# Patient Record
Sex: Male | Born: 1960 | Race: White | Hispanic: No | Marital: Married | Smoking: Former smoker
Health system: Southern US, Community
[De-identification: ages and names within clinical notes are randomized; demographics above are authoritative.]

## PROBLEM LIST (undated history)

## (undated) DIAGNOSIS — J302 Other seasonal allergic rhinitis: Secondary | ICD-10-CM

## (undated) DIAGNOSIS — E785 Hyperlipidemia, unspecified: Secondary | ICD-10-CM

## (undated) DIAGNOSIS — I10 Essential (primary) hypertension: Secondary | ICD-10-CM

## (undated) DIAGNOSIS — E119 Type 2 diabetes mellitus without complications: Secondary | ICD-10-CM

## (undated) HISTORY — PX: VASECTOMY: SHX75

## (undated) HISTORY — PX: APPENDECTOMY: SHX54

## (undated) HISTORY — DX: Essential (primary) hypertension: I10

## (undated) HISTORY — DX: Other seasonal allergic rhinitis: J30.2

## (undated) HISTORY — DX: Hyperlipidemia, unspecified: E78.5

## (undated) HISTORY — DX: Type 2 diabetes mellitus without complications: E11.9

---

## 2014-02-13 LAB — LIPID PANEL
CHOLESTEROL: 136 mg/dL (ref 0–200)
HDL: 29 mg/dL — AB (ref 35–70)
LDL Cholesterol: 84 mg/dL
Triglycerides: 117 mg/dL (ref 40–160)

## 2014-02-13 LAB — BASIC METABOLIC PANEL
BUN: 12 mg/dL (ref 4–21)
Creatinine: 0.9 mg/dL (ref <=1.3)

## 2014-02-13 LAB — PSA: PSA: 0.3

## 2014-09-21 LAB — HEMOGLOBIN A1C: HEMOGLOBIN A1C: 7.5 % — AB (ref 4.0–6.0)

## 2015-02-15 ENCOUNTER — Other Ambulatory Visit: Payer: Self-pay | Admitting: Internal Medicine

## 2015-02-15 ENCOUNTER — Encounter: Payer: Self-pay | Admitting: Internal Medicine

## 2015-02-15 DIAGNOSIS — E119 Type 2 diabetes mellitus without complications: Secondary | ICD-10-CM | POA: Insufficient documentation

## 2015-02-15 DIAGNOSIS — E785 Hyperlipidemia, unspecified: Secondary | ICD-10-CM | POA: Insufficient documentation

## 2015-02-15 DIAGNOSIS — N529 Male erectile dysfunction, unspecified: Secondary | ICD-10-CM | POA: Insufficient documentation

## 2015-02-15 DIAGNOSIS — Z720 Tobacco use: Secondary | ICD-10-CM | POA: Insufficient documentation

## 2015-02-15 DIAGNOSIS — B353 Tinea pedis: Secondary | ICD-10-CM | POA: Insufficient documentation

## 2015-02-15 DIAGNOSIS — I10 Essential (primary) hypertension: Secondary | ICD-10-CM | POA: Insufficient documentation

## 2015-02-22 ENCOUNTER — Encounter: Payer: Self-pay | Admitting: Internal Medicine

## 2015-02-22 ENCOUNTER — Other Ambulatory Visit: Payer: Self-pay | Admitting: Internal Medicine

## 2015-04-14 ENCOUNTER — Other Ambulatory Visit: Payer: Self-pay | Admitting: Internal Medicine

## 2015-05-15 ENCOUNTER — Other Ambulatory Visit: Payer: Self-pay | Admitting: Internal Medicine

## 2015-05-28 ENCOUNTER — Ambulatory Visit: Payer: Self-pay | Admitting: Internal Medicine

## 2015-06-21 ENCOUNTER — Ambulatory Visit: Payer: Self-pay | Admitting: Family Medicine

## 2015-06-28 ENCOUNTER — Ambulatory Visit (INDEPENDENT_AMBULATORY_CARE_PROVIDER_SITE_OTHER): Payer: BLUE CROSS/BLUE SHIELD | Admitting: Family Medicine

## 2015-06-28 ENCOUNTER — Encounter: Payer: Self-pay | Admitting: Family Medicine

## 2015-06-28 VITALS — BP 146/76 | HR 99 | Temp 98.2°F | Ht 72.0 in | Wt 217.5 lb

## 2015-06-28 DIAGNOSIS — I10 Essential (primary) hypertension: Secondary | ICD-10-CM | POA: Diagnosis not present

## 2015-06-28 DIAGNOSIS — E119 Type 2 diabetes mellitus without complications: Secondary | ICD-10-CM

## 2015-06-28 DIAGNOSIS — Z130 Encounter for screening for diseases of the blood and blood-forming organs and certain disorders involving the immune mechanism: Secondary | ICD-10-CM | POA: Diagnosis not present

## 2015-06-28 DIAGNOSIS — Z125 Encounter for screening for malignant neoplasm of prostate: Secondary | ICD-10-CM

## 2015-06-28 DIAGNOSIS — Z0000 Encounter for general adult medical examination without abnormal findings: Secondary | ICD-10-CM

## 2015-06-28 DIAGNOSIS — E785 Hyperlipidemia, unspecified: Secondary | ICD-10-CM

## 2015-06-28 LAB — CBC
HCT: 48.3 % (ref 39.0–52.0)
HEMOGLOBIN: 16 g/dL (ref 13.0–17.0)
MCHC: 33.2 g/dL (ref 30.0–36.0)
MCV: 89.2 fl (ref 78.0–100.0)
Platelets: 277 10*3/uL (ref 150.0–400.0)
RBC: 5.41 Mil/uL (ref 4.22–5.81)
RDW: 12.8 % (ref 11.5–15.5)
WBC: 10.4 10*3/uL (ref 4.0–10.5)

## 2015-06-28 LAB — COMPREHENSIVE METABOLIC PANEL
ALK PHOS: 100 U/L (ref 39–117)
ALT: 26 U/L (ref 0–53)
AST: 18 U/L (ref 0–37)
Albumin: 4.3 g/dL (ref 3.5–5.2)
BILIRUBIN TOTAL: 0.4 mg/dL (ref 0.2–1.2)
BUN: 17 mg/dL (ref 6–23)
CO2: 27 meq/L (ref 19–32)
CREATININE: 0.85 mg/dL (ref 0.40–1.50)
Calcium: 9.7 mg/dL (ref 8.4–10.5)
Chloride: 101 mEq/L (ref 96–112)
GFR: 100.07 mL/min (ref >60.00)
GLUCOSE: 189 mg/dL — AB (ref 70–99)
Potassium: 4.7 mEq/L (ref 3.5–5.1)
SODIUM: 138 meq/L (ref 135–145)
TOTAL PROTEIN: 6.8 g/dL (ref 6.0–8.3)

## 2015-06-28 LAB — MICROALBUMIN / CREATININE URINE RATIO
Creatinine,U: 110.2 mg/dL
Microalb Creat Ratio: 4.5 mg/g (ref 0.0–30.0)
Microalb, Ur: 5 mg/dL — ABNORMAL HIGH (ref 0.0–1.9)

## 2015-06-28 LAB — LIPID PANEL
Cholesterol: 175 mg/dL (ref 0–200)
HDL: 31.5 mg/dL — AB (ref >39.00)
NONHDL: 143.91
Total CHOL/HDL Ratio: 6
Triglycerides: 210 mg/dL — ABNORMAL HIGH (ref 0.0–149.0)
VLDL: 42 mg/dL — ABNORMAL HIGH (ref 0.0–40.0)

## 2015-06-28 LAB — PSA: PSA: 0.26 ng/mL (ref 0.10–4.00)

## 2015-06-28 LAB — HEMOGLOBIN A1C: HEMOGLOBIN A1C: 7.5 % — AB (ref 4.6–6.5)

## 2015-06-28 LAB — LDL CHOLESTEROL, DIRECT: Direct LDL: 117 mg/dL

## 2015-06-28 MED ORDER — AMLODIPINE BESY-BENAZEPRIL HCL 5-10 MG PO CAPS: 2.0000 | ORAL_CAPSULE | Freq: Every day | ORAL | Status: DC

## 2015-06-28 NOTE — Assessment & Plan Note (Signed)
Uncontrolled. Increased amlodipine/benazepril today.

## 2015-06-28 NOTE — Progress Notes (Signed)
Pre visit review using our clinic review tool, if applicable. No additional management support is needed unless otherwise documented below in the visit note. 

## 2015-06-28 NOTE — Assessment & Plan Note (Signed)
Lipid panel today Continue lipitor 

## 2015-06-28 NOTE — Addendum Note (Signed)
Addended by: Tommie Sams on: 06/28/2015 10:16 PM   Modules accepted: Orders

## 2015-06-28 NOTE — Progress Notes (Signed)
Subjective:  Patient ID: Chad Frye, male    DOB: [birth year]  Age: 55 y.o. MRN: [id number]  CC: Establish care, Chronic issues  HPI Chad Frye is a 55 y.o. male presents to the clinic today to establish care. Additional issues are below.  Preventative Healthcare  Colonoscopy: Has never had screening. Declines colonoscopy. Interested in [employer].   Immunizations  Tetanus - declines.  Pneumococcal - declines.  Flu - declines.   Prostate cancer screening: Will discuss today.  Hepatitis C screening - Declines.   Labs: Screening labs today.   Exercise: Does exercise.   Alcohol use: See below.   Smoking/tobacco use: Current smoker.   Regular dental exams: Dentures.   Wears seat belt: Yes.   DM-2  Last documented A1c was 7.5 (May 2016). Uncontrolled.   Currently on metformin and Onglyza.  Does not check sugars.  Tolerating without difficulty.  HTN  Not at goal.  Current on Amlodipine/Benzepril 5/10.  HLD  Last LDL was 84 (2015).  Unsure of control at this time.  Tolerating Lipitor without difficulty.   PMH, Surgical Hx, Family Hx, Social History reviewed and updated as below.  Past Medical History  Diagnosis Date  . Essential hypertension   . DM type 2 (diabetes mellitus, type 2) ([place])   . Hyperlipidemia   . Seasonal allergies      Past Surgical History  Procedure Laterality Date  . Appendectomy    . Vasectomy     Family History  Problem Relation Age of Onset  . Diabetes Mother   . Heart disease Mother   . Stroke Father   . Prostate cancer Father   . Hypertension Father   . COPD Sister   . Heart disease Brother     single heart bypass  . Hypertension Maternal Grandmother    Social History  Substance Use Topics  . Smoking status: Current Every Day Smoker  . Smokeless tobacco: Never Used  . Alcohol Use: 2.4 oz/week    4 Standard drinks or equivalent per week   Review of Systems Complete ROS was negative. See scanned  document. Objective:   Today's Vitals: BP 146/76 mmHg  Pulse 99  Temp(Src) 98.2 F (36.8 C) (Oral)  Ht 6' (1.829 m)  Wt 217 lb 8 oz (98.657 kg)  BMI 29.49 kg/m2  SpO2 97%  Physical Exam  Constitutional: He is oriented to person, place, and time. He appears well-developed. No distress.  HENT:  Head: Normocephalic and atraumatic.  Nose: Nose normal.  Mouth/Throat: Oropharynx is clear and moist. No oropharyngeal exudate.  Normal TM's bilaterally.  Dentures in place (upper and lower).  Eyes: Conjunctivae are normal. No scleral icterus.  Neck: Neck supple.  Cardiovascular: Normal rate and regular rhythm.   No murmur heard. Pulmonary/Chest: Effort normal and breath sounds normal. He has no wheezes. He has no rales.  Abdominal: Soft. He exhibits no distension. There is no tenderness. There is no rebound and no guarding.  Musculoskeletal: Normal range of motion. He exhibits no edema.  Lymphadenopathy:    He has no cervical adenopathy.  Neurological: He is alert and oriented to person, place, and time.  Skin: Skin is warm and dry. No rash noted.  Psychiatric: He has a normal mood and affect.  Vitals reviewed.  Assessment & Plan:   Problem List Items Addressed This Visit    Preventative health care - Primary    Declined flu, Tdap, Pneumovax. After discussion, patient elected to proceed with PSA screening. He declined  rectal exam. Patient does not want a colonoscopy but will proceed with cologuard. Will place order.  Discussed smoking cessation but patient not interested at this time.       Hyperlipidemia    Lipid panel today.  Continue lipitor.      Relevant Medications   amLODipine-benazepril (LOTREL) 5-10 MG capsule   Other Relevant Orders   Lipid Profile   Essential hypertension    Uncontrolled. Increased amlodipine/benazepril today.      Relevant Medications   amLODipine-benazepril (LOTREL) 5-10 MG capsule   Other Relevant Orders   Comp Met (CMET)   Diabetes  mellitus type 2, uncomplicated (HCC)    Uncontrolled. A1C today.  Continuing Onglyza and metformin. Will await A1C prior to altering therapy.       Relevant Medications   amLODipine-benazepril (LOTREL) 5-10 MG capsule   Other Relevant Orders   HgB A1c    Other Visit Diagnoses    Screening for deficiency anemia        Relevant Orders    CBC    Urine Microalbumin w/creat. ratio    Prostate cancer screening        Relevant Orders    PSA       Outpatient Encounter Prescriptions as of 06/28/2015  Medication Sig  . amLODipine-benazepril (LOTREL) 5-10 MG capsule Take 2 capsules by mouth daily.  Marland Kitchen aspirin 81 MG tablet Take 1 tablet by mouth daily.  Marland Kitchen atorvastatin (LIPITOR) 40 MG tablet Take 1.5 tablets by mouth at bedtime.  Marland Kitchen b complex vitamins tablet Take 1 tablet by mouth daily.  . Cinnamon 500 MG TABS Take 8 tablets by mouth daily.  . metFORMIN (GLUCOPHAGE) 1000 MG tablet TAKE 1 TABLET BY MOUTH EVERY MORNING AND 1 AND 1/2 TABLET EVERY EVENING  . MULTIPLE VITAMIN PO Take 1 tablet by mouth daily.  . ONGLYZA 5 MG TABS tablet take 1 tablet by mouth once daily  . vitamin B-12 (CYANOCOBALAMIN) 1000 MCG tablet Take 1,000 mcg by mouth daily.  . [DISCONTINUED] amLODipine-benazepril (LOTREL) 5-10 MG capsule take 1 capsule by mouth once daily  . [DISCONTINUED] sildenafil (VIAGRA) 100 MG tablet Take 1 tablet by mouth daily as needed.   No facility-administered encounter medications on file as of 06/28/2015.    Follow-up: Return in about 6 months (around 12/26/2015).  Otho

## 2015-06-28 NOTE — Patient Instructions (Addendum)
It was nice to see you today.  I have increased your blood pressure medication. Take 2 capsules daily.  We will call with lab results.   Follow up:  Return in about 6 months (around 12/26/2015). You will need repeat metabolic panel in 7-10 days given the increase in your blood pressure medication.  Take care  Dr. Adriana Simas  Health Maintenance, Male A healthy lifestyle and preventative care can promote health and wellness.  Maintain regular health, dental, and eye exams.  Eat a healthy diet. Foods like vegetables, fruits, whole grains, low-fat dairy products, and lean protein foods contain the nutrients you need and are low in calories. Decrease your intake of foods high in solid fats, added sugars, and salt. Get information about a proper diet from your health care provider, if necessary.  Regular physical exercise is one of the most important things you can do for your health. Most adults should get at least 150 minutes of moderate-intensity exercise (any activity that increases your heart rate and causes you to sweat) each week. In addition, most adults need muscle-strengthening exercises on 2 or more days a week.   Maintain a healthy weight. The body mass index (BMI) is a screening tool to identify possible weight problems. It provides an estimate of body fat based on height and weight. Your health care provider can find your BMI and can help you achieve or maintain a healthy weight. For males 20 years and older:  A BMI below 18.5 is considered underweight.  A BMI of 18.5 to 24.9 is normal.  A BMI of 25 to 29.9 is considered overweight.  A BMI of 30 and above is considered obese.  Maintain normal blood lipids and cholesterol by exercising and minimizing your intake of saturated fat. Eat a balanced diet with plenty of fruits and vegetables. Blood tests for lipids and cholesterol should begin at age 44 and be repeated every 5 years. If your lipid or cholesterol levels are high, you are over  age 69, or you are at high risk for heart disease, you may need your cholesterol levels checked more frequently.Ongoing high lipid and cholesterol levels should be treated with medicines if diet and exercise are not working.  If you smoke, find out from your health care provider how to quit. If you do not use tobacco, do not start.  Lung cancer screening is recommended for adults aged 55-80 years who are at high risk for developing lung cancer because of a history of smoking. A yearly low-dose CT scan of the lungs is recommended for people who have at least a 30-pack-year history of smoking and are current smokers or have quit within the past 15 years. A pack year of smoking is smoking an average of 1 pack of cigarettes a day for 1 year (for example, a 30-pack-year history of smoking could mean smoking 1 pack a day for 30 years or 2 packs a day for 15 years). Yearly screening should continue until the smoker has stopped smoking for at least 15 years. Yearly screening should be stopped for people who develop a health problem that would prevent them from having lung cancer treatment.  If you choose to drink alcohol, do not have more than 2 drinks per day. One drink is considered to be 12 oz (360 mL) of beer, 5 oz (150 mL) of wine, or 1.5 oz (45 mL) of liquor.  Avoid the use of street drugs. Do not share needles with anyone. Ask for help if you  need support or instructions about stopping the use of drugs.  High blood pressure causes heart disease and increases the risk of stroke. High blood pressure is more likely to develop in:  People who have blood pressure in the end of the normal range (100-139/85-89 mm Hg).  People who are overweight or obese.  People who are African American.  If you are 54-57 years of age, have your blood pressure checked every 3-5 years. If you are 22 years of age or older, have your blood pressure checked every year. You should have your blood pressure measured twice--once  when you are at a hospital or clinic, and once when you are not at a hospital or clinic. Record the average of the two measurements. To check your blood pressure when you are not at a hospital or clinic, you can use:  An automated blood pressure machine at a pharmacy.  A home blood pressure monitor.  If you are 59-7 years old, ask your health care provider if you should take aspirin to prevent heart disease.  Diabetes screening involves taking a blood sample to check your fasting blood sugar level. This should be done once every 3 years after age 22 if you are at a normal weight and without risk factors for diabetes. Testing should be considered at a younger age or be carried out more frequently if you are overweight and have at least 1 risk factor for diabetes.  Colorectal cancer can be detected and often prevented. Most routine colorectal cancer screening begins at the age of 29 and continues through age 60. However, your health care provider may recommend screening at an earlier age if you have risk factors for colon cancer. On a yearly basis, your health care provider may provide home test kits to check for hidden blood in the stool. A small camera at the end of a tube may be used to directly examine the colon (sigmoidoscopy or colonoscopy) to detect the earliest forms of colorectal cancer. Talk to your health care provider about this at age 43 when routine screening begins. A direct exam of the colon should be repeated every 5-10 years through age 34, unless early forms of precancerous polyps or small growths are found.  People who are at an increased risk for hepatitis B should be screened for this virus. You are considered at high risk for hepatitis B if:  You were born in a country where hepatitis B occurs often. Talk with your health care provider about which countries are considered high risk.  Your parents were born in a high-risk country and you have not received a shot to protect  against hepatitis B (hepatitis B vaccine).  You have HIV or AIDS.  You use needles to inject street drugs.  You live with, or have sex with, someone who has hepatitis B.  You are a man who has sex with other men (MSM).  You get hemodialysis treatment.  You take certain medicines for conditions like cancer, organ transplantation, and autoimmune conditions.  Hepatitis C blood testing is recommended for all people born from [birth year] through 1964 and any individual with known risk factors for hepatitis C.  Healthy men should no longer receive prostate-specific antigen (PSA) blood tests as part of routine cancer screening. Talk to your health care provider about prostate cancer screening.  Testicular cancer screening is not recommended for adolescents or adult males who have no symptoms. Screening includes self-exam, a health care provider exam, and other screening tests. Consult with  your health care provider about any symptoms you have or any concerns you have about testicular cancer.  Practice safe sex. Use condoms and avoid high-risk sexual practices to reduce the spread of sexually transmitted infections (STIs).  You should be screened for STIs, including gonorrhea and chlamydia if:  You are sexually active and are younger than 24 years.  You are older than 24 years, and your health care provider tells you that you are at risk for this type of infection.  Your sexual activity has changed since you were last screened, and you are at an increased risk for chlamydia or gonorrhea. Ask your health care provider if you are at risk.  If you are at risk of being infected with HIV, it is recommended that you take a prescription medicine daily to prevent HIV infection. This is called pre-exposure prophylaxis (PrEP). You are considered at risk if:  You are a man who has sex with other men (MSM).  You are a heterosexual man who is sexually active with multiple partners.  You take drugs by  injection.  You are sexually active with a partner who has HIV.  Talk with your health care provider about whether you are at high risk of being infected with HIV. If you choose to begin PrEP, you should first be tested for HIV. You should then be tested every 3 months for as long as you are taking PrEP.  Use sunscreen. Apply sunscreen liberally and repeatedly throughout the day. You should seek shade when your shadow is shorter than you. Protect yourself by wearing long sleeves, pants, a wide-brimmed hat, and sunglasses year round whenever you are outdoors.  Tell your health care provider of new moles or changes in moles, especially if there is a change in shape or color. Also, tell your health care provider if a mole is larger than the size of a pencil eraser.  A one-time screening for abdominal aortic aneurysm (AAA) and surgical repair of large AAAs by ultrasound is recommended for men aged 71-75 years who are current or former smokers.  Stay current with your vaccines (immunizations).   This information is not intended to replace advice given to you by your health care provider. Make sure you discuss any questions you have with your health care provider.   Document Released: 11/04/2007 Document Revised: 05/29/2014 Document Reviewed: 10/03/2010 Elsevier Interactive Patient Education [employer].

## 2015-06-28 NOTE — Assessment & Plan Note (Signed)
Uncontrolled. A1C today.  Continuing Onglyza and metformin. Will await A1C prior to altering therapy.

## 2015-06-28 NOTE — Assessment & Plan Note (Signed)
Declined flu, Tdap, Pneumovax. After discussion, patient elected to proceed with PSA screening. He declined rectal exam. Patient does not want a colonoscopy but will proceed with cologuard. Will place order.  Discussed smoking cessation but patient not interested at this time.

## 2015-06-29 ENCOUNTER — Other Ambulatory Visit: Payer: Self-pay | Admitting: Family Medicine

## 2015-06-29 ENCOUNTER — Other Ambulatory Visit: Payer: Self-pay

## 2015-06-29 ENCOUNTER — Encounter: Payer: Self-pay | Admitting: Family Medicine

## 2015-06-29 MED ORDER — ATORVASTATIN CALCIUM 80 MG PO TABS: 80.0000 mg | ORAL_TABLET | Freq: Every day | ORAL | Status: DC

## 2015-06-29 MED ORDER — GLIMEPIRIDE 2 MG PO TABS: ORAL_TABLET | ORAL | Status: DC

## 2015-06-29 NOTE — Telephone Encounter (Signed)
Lipitor  changed to Lipitor  per Dr. Adriana Simas

## 2015-07-07 ENCOUNTER — Telehealth: Payer: Self-pay | Admitting: Family Medicine

## 2015-07-07 LAB — HM DIABETES EYE EXAM

## 2015-07-07 NOTE — Telephone Encounter (Signed)
LVTCB

## 2015-07-07 NOTE — Telephone Encounter (Signed)
I don't know what this is for---

## 2015-07-07 NOTE — Telephone Encounter (Signed)
Chad Frye spoke with patient, labs were already drawn.

## 2015-07-07 NOTE — Telephone Encounter (Signed)
Patient is requesting labs, just had labs done 2/6.  Please advise. thanks

## 2015-07-07 NOTE — Telephone Encounter (Signed)
Pt called about needing order for metabolic lab work. Let me know. Call pt @ [phone]. Thank you!

## 2015-07-08 ENCOUNTER — Encounter: Payer: Self-pay | Admitting: Family Medicine

## 2015-07-20 ENCOUNTER — Other Ambulatory Visit: Payer: Self-pay | Admitting: Family Medicine

## 2015-07-20 ENCOUNTER — Other Ambulatory Visit: Payer: Self-pay | Admitting: Internal Medicine

## 2015-07-20 NOTE — Telephone Encounter (Signed)
These medications were not prescribed by Dr.Cook. Please advise?

## 2015-10-26 ENCOUNTER — Other Ambulatory Visit: Payer: Self-pay | Admitting: Internal Medicine

## 2015-11-18 ENCOUNTER — Other Ambulatory Visit: Payer: Self-pay | Admitting: Internal Medicine

## 2015-12-27 ENCOUNTER — Encounter: Payer: Self-pay | Admitting: Family Medicine

## 2015-12-27 ENCOUNTER — Ambulatory Visit (INDEPENDENT_AMBULATORY_CARE_PROVIDER_SITE_OTHER): Payer: BLUE CROSS/BLUE SHIELD | Admitting: Family Medicine

## 2015-12-27 VITALS — BP 146/86 | HR 85 | Temp 97.8°F | Wt 220.4 lb

## 2015-12-27 DIAGNOSIS — Z720 Tobacco use: Secondary | ICD-10-CM | POA: Diagnosis not present

## 2015-12-27 DIAGNOSIS — I10 Essential (primary) hypertension: Secondary | ICD-10-CM | POA: Diagnosis not present

## 2015-12-27 DIAGNOSIS — R7989 Other specified abnormal findings of blood chemistry: Secondary | ICD-10-CM

## 2015-12-27 DIAGNOSIS — E785 Hyperlipidemia, unspecified: Secondary | ICD-10-CM

## 2015-12-27 DIAGNOSIS — E119 Type 2 diabetes mellitus without complications: Secondary | ICD-10-CM

## 2015-12-27 LAB — LIPID PANEL
CHOL/HDL RATIO: 5
Cholesterol: 134 mg/dL (ref 0–200)
HDL: 29.7 mg/dL — ABNORMAL LOW (ref >39.00)
NONHDL: 104.09
TRIGLYCERIDES: 214 mg/dL — AB (ref 0.0–149.0)
VLDL: 42.8 mg/dL — ABNORMAL HIGH (ref 0.0–40.0)

## 2015-12-27 LAB — HEMOGLOBIN A1C: HEMOGLOBIN A1C: 7.9 % — AB (ref 4.6–6.5)

## 2015-12-27 LAB — LDL CHOLESTEROL, DIRECT: LDL DIRECT: 85 mg/dL

## 2015-12-27 MED ORDER — BLOOD GLUCOSE MONITOR KIT: PACK | 0 refills | Status: DC

## 2015-12-27 NOTE — Patient Instructions (Signed)
Continue your current medications.  Follow up in 3-6 months.  Take care  Dr. Latrelle Fuston  

## 2015-12-27 NOTE — Assessment & Plan Note (Signed)
Not at goal. Lipid panel today. Continue Lipitor.

## 2015-12-27 NOTE — Assessment & Plan Note (Signed)
Not at goal. Repeating A1c today. Continue metformin, Amaryl, Onglyza.

## 2015-12-27 NOTE — Progress Notes (Signed)
Subjective:  Patient ID: Chad Frye, male    DOB: 07/04/1960  Age: 55 y.o. MRN: [id number]  CC: Followup  HPI:  55 year old male with hypertension, DM 2, hyperlipidemia, and tobacco abuse presents for follow-up.  HTN  Stable but not at goal.  Compliant with Lotrel.  DM2  Blood sugars readings - Not checking. Wants meter.  Hypoglycemia - No.  Medications - Onglyza, Amaryl, Metformin.  Adverse effects - None.  Compliance - Yes. Preventative care - Up to date.  HLD  Not at goal.  Lipitor was increased at last visit.  Needs recheck today.  Tobacco abuse  Quit.  Has not had a cigarette for 1 month.  Social Hx   Social History   Social History  . Marital status: Married    Spouse name: N/A  . Number of children: N/A  . Years of education: N/A   Social History Main Topics  . Smoking status: Current Every Day Smoker  . Smokeless tobacco: Never Used  . Alcohol use 2.4 oz/week    4 Standard drinks or equivalent per week  . Drug use: No  . Sexual activity: Yes   Other Topics Concern  . None   Social History Narrative  . None   Review of Systems  Constitutional: Negative.   Respiratory: Negative.   Cardiovascular: Negative.    Objective:  BP (!) 146/86 (BP Location: Right Arm, Patient Position: Sitting, Cuff Size: Normal)   Pulse 85   Temp 97.8 F (36.6 C) (Oral)   Wt 220 lb 6 oz (100 kg)   SpO2 95%   BMI 29.89 kg/m   BP/Weight 08/24/8183 10/23/1495  Systolic BP 026 378  Diastolic BP 86 76  Wt. (Lbs) 220.38 217.5  BMI 29.89 29.49   Physical Exam  Constitutional: He is oriented to person, place, and time. He appears well-developed. No distress.  Cardiovascular: Normal rate and regular rhythm.   Pulmonary/Chest: Effort normal. He has no wheezes. He has no rales.  Neurological: He is alert and oriented to person, place, and time.  Psychiatric: He has a normal mood and affect.  Vitals reviewed.  Lab Results  Component Value Date   WBC  10.4 06/28/2015   HGB 16.0 06/28/2015   HCT 48.3 06/28/2015   PLT 277.0 06/28/2015   GLUCOSE 189 (H) 06/28/2015   CHOL 175 06/28/2015   TRIG 210.0 (H) 06/28/2015   HDL 31.50 (L) 06/28/2015   LDLDIRECT 117.0 06/28/2015   LDLCALC 84 02/13/2014   ALT 26 06/28/2015   AST 18 06/28/2015   NA 138 06/28/2015   K 4.7 06/28/2015   CL 101 06/28/2015   CREATININE 0.85 06/28/2015   BUN 17 06/28/2015   CO2 27 06/28/2015   PSA 0.26 06/28/2015   HGBA1C 7.5 (H) 06/28/2015   MICROALBUR 5.0 (H) 06/28/2015    Assessment & Plan:   Problem List Items Addressed This Visit    Diabetes mellitus type 2, uncomplicated ([place])    Not at goal. Repeating A1c today. Continue metformin, Amaryl, Onglyza.      Relevant Orders   HgB A1c   Essential hypertension - Primary    BP elevated today and on repeat. He's nearly a goal. Advised close monitoring at home. If remains elevated at home will increase therapy. Advised weight loss.      Hyperlipidemia    Not at goal. Lipid panel today. Continue Lipitor.      Relevant Orders   Lipid Profile   Tobacco abuse  Patient has quit. Congratulated.       Other Visit Diagnoses   None.     Meds ordered this encounter  Medications  . blood glucose meter kit and supplies KIT    Sig: Dispense based on patient and insurance preference. Use up to four times daily as directed. (FOR ICD-9 250.00, 250.01).    Dispense:  1 each    Refill:  0    Order Specific Question:   Number of strips    Answer:   100    Order Specific Question:   Number of lancets    Answer:   100    Follow-up: 3-6 months.  [name]

## 2015-12-27 NOTE — Progress Notes (Signed)
Pre visit review using our clinic review tool, if applicable. No additional management support is needed unless otherwise documented below in the visit note. 

## 2015-12-27 NOTE — Assessment & Plan Note (Signed)
BP elevated today and on repeat. He's nearly a goal. Advised close monitoring at home. If remains elevated at home will increase therapy. Advised weight loss.

## 2015-12-27 NOTE — Assessment & Plan Note (Signed)
Patient has quit. Congratulated.

## 2016-01-11 ENCOUNTER — Other Ambulatory Visit: Payer: Self-pay | Admitting: Family Medicine

## 2016-01-18 ENCOUNTER — Other Ambulatory Visit: Payer: Self-pay | Admitting: Family Medicine

## 2016-01-21 ENCOUNTER — Encounter: Payer: Self-pay | Admitting: Family Medicine

## 2016-01-21 ENCOUNTER — Ambulatory Visit (INDEPENDENT_AMBULATORY_CARE_PROVIDER_SITE_OTHER): Payer: BLUE CROSS/BLUE SHIELD | Admitting: Family Medicine

## 2016-01-21 DIAGNOSIS — E785 Hyperlipidemia, unspecified: Secondary | ICD-10-CM | POA: Diagnosis not present

## 2016-01-21 DIAGNOSIS — E119 Type 2 diabetes mellitus without complications: Secondary | ICD-10-CM | POA: Diagnosis not present

## 2016-01-21 DIAGNOSIS — I10 Essential (primary) hypertension: Secondary | ICD-10-CM | POA: Diagnosis not present

## 2016-01-21 MED ORDER — GLIMEPIRIDE 4 MG PO TABS
4.0000 mg | ORAL_TABLET | Freq: Every day | ORAL | 1 refills | Status: DC
Start: 2016-01-21 — End: 2016-06-26

## 2016-01-21 NOTE — Assessment & Plan Note (Signed)
Not at goal. Increasing amaryl. Follow up in 3 months.

## 2016-01-21 NOTE — Assessment & Plan Note (Signed)
Well controlled.  Continue Lotrel.

## 2016-01-21 NOTE — Assessment & Plan Note (Signed)
Stable. On max dose lipitor. Could advocate for lower goal. Will continue current treatment and monitor.  Recheck in 3 months.

## 2016-01-21 NOTE — Progress Notes (Signed)
Subjective:  Patient ID: Chad Frye, male    DOB: 1960/07/07  Age: 55 y.o. MRN: [id number]  CC: Follow up  HPI:  55 year old male with hypertension, hyperlipidemia, DM 2 presents for follow-up.  HTN  At goal on Lotrel.  HLD  Stable at this time.  Most recent LDL was 85.  On max dose lipitor and tolerating.  DM-2  Not at goal.  Compliant with metformin, Onglyza, Amaryl.  Most recent A1C was 7.5.  Need to discuss treatment options today.   Social Hx   Social History   Social History  . Marital status: Married    Spouse name: N/A  . Number of children: N/A  . Years of education: N/A   Social History Main Topics  . Smoking status: Former Smoker    Quit date: 11/28/2015  . Smokeless tobacco: Never Used  . Alcohol use 2.4 oz/week    4 Standard drinks or equivalent per week  . Drug use: No  . Sexual activity: Yes   Other Topics Concern  . None   Social History Narrative  . None   Review of Systems  Constitutional: Negative.   Respiratory: Negative.   Cardiovascular: Negative.    Objective:  BP 132/84 (BP Location: Right Arm, Patient Position: Sitting, Cuff Size: Large)   Pulse 76   Temp 98 F (36.7 C) (Oral)   Resp 16   Wt 217 lb (98.4 kg)   BMI 29.43 kg/m   BP/Weight 01/21/2016 12/27/2015 06/28/2015  Systolic BP 132 146 146  Diastolic BP 84 86 76  Wt. (Lbs) 217 220.38 217.5  BMI 29.43 29.89 29.49   Physical Exam  Constitutional: He is oriented to person, place, and time. He appears well-developed. No distress.  Cardiovascular: Normal rate and regular rhythm.   Pulmonary/Chest: Effort normal. He has no wheezes. He has no rales.  Neurological: He is alert and oriented to person, place, and time.  Psychiatric: He has a normal mood and affect.  Vitals reviewed.  Lab Results  Component Value Date   WBC 10.4 06/28/2015   HGB 16.0 06/28/2015   HCT 48.3 06/28/2015   PLT 277.0 06/28/2015   GLUCOSE 189 (H) 06/28/2015   CHOL 134 12/27/2015   TRIG 214.0 (H) 12/27/2015   HDL 29.70 (L) 12/27/2015   LDLDIRECT 85.0 12/27/2015   LDLCALC 84 02/13/2014   ALT 26 06/28/2015   AST 18 06/28/2015   NA 138 06/28/2015   K 4.7 06/28/2015   CL 101 06/28/2015   CREATININE 0.85 06/28/2015   BUN 17 06/28/2015   CO2 27 06/28/2015   PSA 0.26 06/28/2015   HGBA1C 7.9 (H) 12/27/2015   MICROALBUR 5.0 (H) 06/28/2015   Assessment & Plan:   Problem List Items Addressed This Visit    Diabetes mellitus type 2, uncomplicated (HCC)    Not at goal. Increasing amaryl. Follow up in 3 months.      Relevant Medications   glimepiride (AMARYL) 4 MG tablet   Essential hypertension    Well controlled. Continue Lotrel.      Hyperlipidemia    Stable. On max dose lipitor. Could advocate for lower goal. Will continue current treatment and monitor.  Recheck in 3 months.       Other Visit Diagnoses   None.    Meds ordered this encounter  Medications  . glimepiride (AMARYL) 4 MG tablet    Sig: Take 1 tablet (4 mg total) by mouth daily. With first meal of the day.  Dispense:  90 tablet    Refill:  1    Follow-up: Return in about 3 months (around 04/21/2016).  Everlene OtherJayce Jahmeir Geisen DO [facility] [place]

## 2016-01-21 NOTE — Patient Instructions (Signed)
I have increased the amaryl to 4 mg daily.  The rest of your meds are the same.  Follow up in 3 months.  Take care  Dr. Adriana Simasook

## 2016-03-13 ENCOUNTER — Other Ambulatory Visit: Payer: Self-pay | Admitting: Family Medicine

## 2016-04-24 ENCOUNTER — Ambulatory Visit: Payer: BLUE CROSS/BLUE SHIELD | Admitting: Family Medicine

## 2016-05-13 ENCOUNTER — Other Ambulatory Visit: Payer: Self-pay | Admitting: Family Medicine

## 2016-06-13 ENCOUNTER — Other Ambulatory Visit: Payer: Self-pay | Admitting: Family Medicine

## 2016-06-26 ENCOUNTER — Ambulatory Visit (INDEPENDENT_AMBULATORY_CARE_PROVIDER_SITE_OTHER): Payer: BLUE CROSS/BLUE SHIELD | Admitting: Family Medicine

## 2016-06-26 ENCOUNTER — Encounter: Payer: Self-pay | Admitting: Family Medicine

## 2016-06-26 VITALS — BP 147/90 | HR 85 | Temp 98.2°F | Wt 224.0 lb

## 2016-06-26 DIAGNOSIS — E119 Type 2 diabetes mellitus without complications: Secondary | ICD-10-CM | POA: Diagnosis not present

## 2016-06-26 DIAGNOSIS — I10 Essential (primary) hypertension: Secondary | ICD-10-CM

## 2016-06-26 DIAGNOSIS — E785 Hyperlipidemia, unspecified: Secondary | ICD-10-CM | POA: Diagnosis not present

## 2016-06-26 LAB — COMPREHENSIVE METABOLIC PANEL
ALT: 29 U/L (ref 0–53)
AST: 23 U/L (ref 0–37)
Albumin: 4.6 g/dL (ref 3.5–5.2)
Alkaline Phosphatase: 105 U/L (ref 39–117)
BUN: 19 mg/dL (ref 6–23)
CO2: 25 meq/L (ref 19–32)
Calcium: 9.6 mg/dL (ref 8.4–10.5)
Chloride: 105 mEq/L (ref 96–112)
Creatinine, Ser: 0.9 mg/dL (ref 0.40–1.50)
GFR: 93.33 mL/min (ref >60.00)
GLUCOSE: 107 mg/dL — AB (ref 70–99)
Potassium: 3.9 mEq/L (ref 3.5–5.1)
SODIUM: 137 meq/L (ref 135–145)
Total Bilirubin: 0.4 mg/dL (ref 0.2–1.2)
Total Protein: 7.4 g/dL (ref 6.0–8.3)

## 2016-06-26 LAB — LIPID PANEL
CHOL/HDL RATIO: 4
Cholesterol: 104 mg/dL (ref 0–200)
HDL: 26.3 mg/dL — ABNORMAL LOW (ref >39.00)
LDL CALC: 57 mg/dL (ref 0–99)
NONHDL: 78.02
TRIGLYCERIDES: 105 mg/dL (ref 0.0–149.0)
VLDL: 21 mg/dL (ref 0.0–40.0)

## 2016-06-26 LAB — HEMOGLOBIN A1C: Hgb A1c MFr Bld: 7.5 % — ABNORMAL HIGH (ref 4.6–6.5)

## 2016-06-26 MED ORDER — GLIMEPIRIDE 4 MG PO TABS: 4.0000 mg | ORAL_TABLET | Freq: Every day | ORAL | 1 refills | Status: DC

## 2016-06-26 MED ORDER — AMLODIPINE BESY-BENAZEPRIL HCL 5-10 MG PO CAPS: 2.0000 | ORAL_CAPSULE | Freq: Every day | ORAL | 3 refills | Status: DC

## 2016-06-26 MED ORDER — SAXAGLIPTIN HCL 5 MG PO TABS: 5.0000 mg | ORAL_TABLET | Freq: Every day | ORAL | 1 refills | Status: DC

## 2016-06-26 MED ORDER — METFORMIN HCL 1000 MG PO TABS: ORAL_TABLET | ORAL | 1 refills | Status: DC

## 2016-06-26 NOTE — Assessment & Plan Note (Signed)
Not at goal. Lipid panel today. We'll change therapy pending lipid panel results. For now continue Lipitor.

## 2016-06-26 NOTE — Assessment & Plan Note (Signed)
BP elevated today. Patient endorses that his blood pressure is well controlled on. Continuing Lotrel.

## 2016-06-26 NOTE — Assessment & Plan Note (Signed)
Last A1c was 7.9. He endorses that his blood sugars have improved. Rechecking A1c today. Continue metformin, Amaryl, Onglyza.

## 2016-06-26 NOTE — Patient Instructions (Signed)
We will call with your results.  Keep an eye on your pressure. Goal 130/80.  Follow up in 3 months.  Take care  Dr. Adriana Simasook

## 2016-06-26 NOTE — Progress Notes (Signed)
Subjective:  Patient ID: Chad Frye, male    DOB: Jan 11, 1961  Age: 56 y.o. MRN: [id number]  CC: Follow up  HPI:  56 year old male with hypertension, DM 2, hyperlipidemia presents for follow-up.  HTN  BP elevated today. Well controlled at home.  Compliant with Lotrel. No side effects.  DM-2  Blood sugars well controlled at home per his report (120's).  He is currently on metformin, Amaryl, and Onglyza.  Endorses compliance.  HLD  Not at goal given DM-2.  On max dose Lipitor.  Social Hx   Social History   Social History  . Marital status: Married    Spouse name: N/A  . Number of children: N/A  . Years of education: N/A   Social History Main Topics  . Smoking status: Former Smoker    Quit date: 11/28/2015  . Smokeless tobacco: Never Used  . Alcohol use 2.4 oz/week    4 Standard drinks or equivalent per week  . Drug use: No  . Sexual activity: Yes   Other Topics Concern  . None   Social History Narrative  . None   Review of Systems  Respiratory: Negative.   Cardiovascular: Negative.    Objective:  BP (!) 147/90   Pulse 85   Temp 98.2 F (36.8 C) (Oral)   Wt 224 lb (101.6 kg)   SpO2 96%   BMI 30.38 kg/m   BP/Weight 06/26/2016 01/21/2016 12/27/2015  Systolic BP 147 132 146  Diastolic BP 90 84 86  Wt. (Lbs) 224 217 220.38  BMI 30.38 29.43 29.89   Physical Exam  Constitutional: He is oriented to person, place, and time. He appears well-developed. No distress.  Cardiovascular: Normal rate and regular rhythm.   Pulmonary/Chest: Effort normal and breath sounds normal.  Neurological: He is alert and oriented to person, place, and time.  Psychiatric: He has a normal mood and affect.  Vitals reviewed.   Lab Results  Component Value Date   WBC 10.4 06/28/2015   HGB 16.0 06/28/2015   HCT 48.3 06/28/2015   PLT 277.0 06/28/2015   GLUCOSE 189 (H) 06/28/2015   CHOL 134 12/27/2015   TRIG 214.0 (H) 12/27/2015   HDL 29.70 (L) 12/27/2015   LDLDIRECT  85.0 12/27/2015   LDLCALC 84 02/13/2014   ALT 26 06/28/2015   AST 18 06/28/2015   NA 138 06/28/2015   K 4.7 06/28/2015   CL 101 06/28/2015   CREATININE 0.85 06/28/2015   BUN 17 06/28/2015   CO2 27 06/28/2015   PSA 0.26 06/28/2015   HGBA1C 7.9 (H) 12/27/2015   MICROALBUR 5.0 (H) 06/28/2015    Assessment & Plan:   Problem List Items Addressed This Visit    Diabetes mellitus type 2, uncomplicated (HCC)    Last A1c was 7.9. He endorses that his blood sugars have improved. Rechecking A1c today. Continue metformin, Amaryl, Onglyza.      Relevant Medications   amLODipine-benazepril (LOTREL) 5-10 MG capsule   glimepiride (AMARYL) 4 MG tablet   metFORMIN (GLUCOPHAGE) 1000 MG tablet   saxagliptin HCl (ONGLYZA) 5 MG TABS tablet   Other Relevant Orders   Hemoglobin A1c   Essential hypertension - Primary    BP elevated today. Patient endorses that his blood pressure is well controlled on. Continuing Lotrel.      Relevant Medications   amLODipine-benazepril (LOTREL) 5-10 MG capsule   Other Relevant Orders   Comprehensive metabolic panel   Hyperlipidemia    Not at goal. Lipid panel today. We'll change  therapy pending lipid panel results. For now continue Lipitor.      Relevant Medications   amLODipine-benazepril (LOTREL) 5-10 MG capsule   Other Relevant Orders   Lipid panel     Meds ordered this encounter  Medications  . amLODipine-benazepril (LOTREL) 5-10 MG capsule    Sig: Take 2 capsules by mouth daily.    Dispense:  180 capsule    Refill:  3  . glimepiride (AMARYL) 4 MG tablet    Sig: Take 1 tablet (4 mg total) by mouth daily. With first meal of the day.    Dispense:  90 tablet    Refill:  1  . metFORMIN (GLUCOPHAGE) 1000 MG tablet    Sig: take 1 tablet by mouth every morning and 1 and 1/2 tablet every evening    Dispense:  75 tablet    Refill:  1  . saxagliptin HCl (ONGLYZA) 5 MG TABS tablet    Sig: Take 1 tablet (5 mg total) by mouth daily.    Dispense:  90  tablet    Refill:  1   Follow-up: 3 months.  Everlene Other DO [facility]

## 2016-06-26 NOTE — Progress Notes (Signed)
Pre visit review using our clinic review tool, if applicable. No additional management support is needed unless otherwise documented below in the visit note. 

## 2016-06-27 ENCOUNTER — Other Ambulatory Visit: Payer: Self-pay | Admitting: Family Medicine

## 2016-06-27 MED ORDER — EMPAGLIFLOZIN 10 MG PO TABS: 10.0000 mg | ORAL_TABLET | Freq: Every day | ORAL | 3 refills | Status: DC

## 2016-09-18 DIAGNOSIS — E119 Type 2 diabetes mellitus without complications: Secondary | ICD-10-CM | POA: Diagnosis not present

## 2016-09-18 LAB — HM DIABETES EYE EXAM

## 2016-09-19 ENCOUNTER — Encounter: Payer: Self-pay | Admitting: Family Medicine

## 2016-09-25 ENCOUNTER — Ambulatory Visit: Payer: BLUE CROSS/BLUE SHIELD | Admitting: Family Medicine

## 2016-10-20 ENCOUNTER — Ambulatory Visit: Payer: BLUE CROSS/BLUE SHIELD | Admitting: Family Medicine

## 2016-10-27 ENCOUNTER — Encounter: Payer: Self-pay | Admitting: Family Medicine

## 2016-10-27 ENCOUNTER — Ambulatory Visit (INDEPENDENT_AMBULATORY_CARE_PROVIDER_SITE_OTHER): Payer: BLUE CROSS/BLUE SHIELD | Admitting: Family Medicine

## 2016-10-27 VITALS — BP 138/96 | HR 101 | Temp 98.5°F | Resp 16 | Ht 73.0 in | Wt 211.5 lb

## 2016-10-27 DIAGNOSIS — I10 Essential (primary) hypertension: Secondary | ICD-10-CM

## 2016-10-27 DIAGNOSIS — E785 Hyperlipidemia, unspecified: Secondary | ICD-10-CM | POA: Diagnosis not present

## 2016-10-27 DIAGNOSIS — E119 Type 2 diabetes mellitus without complications: Secondary | ICD-10-CM

## 2016-10-27 LAB — POCT GLYCOSYLATED HEMOGLOBIN (HGB A1C): HEMOGLOBIN A1C: 6.4

## 2016-10-27 MED ORDER — AMLODIPINE BESY-BENAZEPRIL HCL 10-40 MG PO CAPS: 1.0000 | ORAL_CAPSULE | Freq: Every day | ORAL | 1 refills | Status: DC

## 2016-10-27 NOTE — Assessment & Plan Note (Signed)
At goal on Lipitor. Continue. 

## 2016-10-27 NOTE — Assessment & Plan Note (Signed)
At goal. A1C 6.4 today. Continue current diabetes meds.

## 2016-10-27 NOTE — Assessment & Plan Note (Signed)
Uncontrolled. Increasing Lotrel. BMP in 7-10 days.

## 2016-10-27 NOTE — Progress Notes (Signed)
Subjective:  Patient ID: Chad Frye, male    DOB: [birth year]  Age: 56 y.o. MRN: [id number]  CC: Follow up  HPI:  56 year old male with hypertension, hyperlipidemia, DM 2 presents for follow-up.  DM 2  Blood sugars readings - [place] 89 - 160's.  Hypoglycemia - No.  Medications - Jardiance, Onglyza, Metformin, Amaryl.  Adverse effects - No.  Compliance - Yes.   HLD  At goal on Lipitor. Continue. No side effects.  HTN  BP elevated today and elevated at home. Goal 1:30/90.  Compliant with Lotrel 5/10 BID.   Social Hx   Social History   Social History  . Marital status: Married    Spouse name: N/A  . Number of children: N/A  . Years of education: N/A   Social History Main Topics  . Smoking status: Former Smoker    Quit date: 11/28/2015  . Smokeless tobacco: Never Used  . Alcohol use 2.4 oz/week    4 Standard drinks or equivalent per week  . Drug use: No  . Sexual activity: Yes   Other Topics Concern  . None   Social History Narrative  . None    Review of Systems  Constitutional: Negative.   Respiratory: Negative.   Cardiovascular: Negative.    Objective:  BP (!) 138/96   Pulse (!) 101   Temp 98.5 F (36.9 C) (Oral)   Resp 16   Ht 6\' 1"  (1.854 m)   Wt 211 lb 8 oz (95.9 kg)   SpO2 96%   BMI 27.90 kg/m   BP/Weight 10/27/2016 06/26/2016 01/21/2016  Systolic BP 138 147 132  Diastolic BP 96 90 84  Wt. (Lbs) 211.5 224 217  BMI 27.9 30.38 29.43   Physical Exam  Constitutional: He is oriented to person, place, and time. He appears well-developed. No distress.  Cardiovascular: Normal rate and regular rhythm.   Pulmonary/Chest: Effort normal and breath sounds normal. He has no wheezes. He has no rales.  Neurological: He is alert and oriented to person, place, and time.  Psychiatric: He has a normal mood and affect.  Vitals reviewed.   Lab Results  Component Value Date   WBC 10.4 06/28/2015   HGB 16.0 06/28/2015   HCT 48.3 06/28/2015   PLT  277.0 06/28/2015   GLUCOSE 107 (H) 06/26/2016   CHOL 104 06/26/2016   TRIG 105.0 06/26/2016   HDL 26.30 (L) 06/26/2016   LDLDIRECT 85.0 12/27/2015   LDLCALC 57 06/26/2016   ALT 29 06/26/2016   AST 23 06/26/2016   NA 137 06/26/2016   K 3.9 06/26/2016   CL 105 06/26/2016   CREATININE 0.90 06/26/2016   BUN 19 06/26/2016   CO2 25 06/26/2016   PSA 0.26 06/28/2015   HGBA1C 6.4 10/27/2016   MICROALBUR 5.0 (H) 06/28/2015    Assessment & Plan:   Problem List Items Addressed This Visit      Cardiovascular and Mediastinum   Essential hypertension    Uncontrolled. Increasing Lotrel. BMP in 7-10 days.       Relevant Medications   amLODipine-benazepril (LOTREL) 10-40 MG capsule     Endocrine   Diabetes mellitus type 2, uncomplicated (HCC) - Primary    At goal. A1C 6.4 today. Continue current diabetes meds.      Relevant Medications   amLODipine-benazepril (LOTREL) 10-40 MG capsule   Other Relevant Orders   POCT glycosylated hemoglobin (Hb A1C) (Completed)   Basic Metabolic Panel (BMET)     Other   Hyperlipidemia  At goal on Lipitor. Continue.       Relevant Medications   amLODipine-benazepril (LOTREL) 10-40 MG capsule      Meds ordered this encounter  Medications  . amLODipine-benazepril (LOTREL) 10-40 MG capsule    Sig: Take 1 capsule by mouth daily.    Dispense:  90 capsule    Refill:  1    Follow-up: 3 months  Charlottie Peragine Adriana Simasook DO [facility] [place]

## 2016-10-27 NOTE — Patient Instructions (Signed)
Increased Lotrel.  Other medications are the same.  Follow up in 3 months.  Labs in 7-10 days.  Take care  Dr. Adriana Simasook

## 2016-11-06 ENCOUNTER — Other Ambulatory Visit (INDEPENDENT_AMBULATORY_CARE_PROVIDER_SITE_OTHER): Payer: BLUE CROSS/BLUE SHIELD

## 2016-11-06 DIAGNOSIS — E119 Type 2 diabetes mellitus without complications: Secondary | ICD-10-CM | POA: Diagnosis not present

## 2016-11-07 LAB — BASIC METABOLIC PANEL
BUN: 12 mg/dL (ref 6–23)
CHLORIDE: 101 meq/L (ref 96–112)
CO2: 25 mEq/L (ref 19–32)
CREATININE: 0.93 mg/dL (ref 0.40–1.50)
Calcium: 10 mg/dL (ref 8.4–10.5)
GFR: 89.74 mL/min (ref >60.00)
Glucose, Bld: 93 mg/dL (ref 70–99)
Potassium: 4.3 mEq/L (ref 3.5–5.1)
SODIUM: 137 meq/L (ref 135–145)

## 2016-12-25 ENCOUNTER — Other Ambulatory Visit: Payer: Self-pay | Admitting: Family Medicine

## 2016-12-26 ENCOUNTER — Other Ambulatory Visit: Payer: Self-pay

## 2016-12-26 MED ORDER — ATORVASTATIN CALCIUM 80 MG PO TABS: 80.0000 mg | ORAL_TABLET | Freq: Every day | ORAL | 0 refills | Status: DC

## 2016-12-29 ENCOUNTER — Encounter: Payer: Self-pay | Admitting: Family Medicine

## 2017-01-26 ENCOUNTER — Encounter: Payer: Self-pay | Admitting: Family Medicine

## 2017-01-26 ENCOUNTER — Ambulatory Visit (INDEPENDENT_AMBULATORY_CARE_PROVIDER_SITE_OTHER): Payer: BLUE CROSS/BLUE SHIELD | Admitting: Family Medicine

## 2017-01-26 DIAGNOSIS — I10 Essential (primary) hypertension: Secondary | ICD-10-CM

## 2017-01-26 DIAGNOSIS — E785 Hyperlipidemia, unspecified: Secondary | ICD-10-CM | POA: Diagnosis not present

## 2017-01-26 DIAGNOSIS — E119 Type 2 diabetes mellitus without complications: Secondary | ICD-10-CM

## 2017-01-26 NOTE — Patient Instructions (Signed)
Stop the Amaryl.  Continue your other meds.  Follow up in 3-6 months.  Take care  Dr. Adriana Simasook

## 2017-01-26 NOTE — Progress Notes (Signed)
6 

## 2017-01-29 NOTE — Assessment & Plan Note (Signed)
Stable on Lotrel.

## 2017-01-29 NOTE — Assessment & Plan Note (Signed)
At goal on lipitor. 

## 2017-01-29 NOTE — Progress Notes (Signed)
   Subjective:  Patient ID: Chad Frye, male    DOB: 02-03-1961  Age: 56 y.o. MRN: [id number]  CC: Follow up  HPI:  56 year old male with HTN, HLD, DM-2 presents for follow up.  DM-2  A1C at goal.   Has had some low sugars (symptomatic in the 80's).  Compliant with Jardiance, Metformin, Onglyza, Amaryl.  HTN  BP elevated today. He endorses that his blood pressure is well controlled at home.  Compliant with Lotrel.  HLD  At goal on Lipitor.  Social Hx   Social History   Social History  . Marital status: Married    Spouse name: N/A  . Number of children: N/A  . Years of education: N/A   Social History Main Topics  . Smoking status: Current Every Day Smoker    Last attempt to quit: 11/28/2015  . Smokeless tobacco: Never Used  . Alcohol use 2.4 oz/week    4 Standard drinks or equivalent per week  . Drug use: No  . Sexual activity: Yes   Other Topics Concern  . None   Social History Narrative  . None    Review of Systems  Constitutional: Negative.   Respiratory: Negative.   Cardiovascular: Negative.    Objective:  BP (!) 160/70   Pulse 93   Temp 98.1 F (36.7 C) (Oral)   Wt 208 lb 9.6 oz (94.6 kg)   SpO2 93%   BMI 27.52 kg/m   BP/Weight 01/26/2017 10/27/2016 06/26/2016  Systolic BP 160 138 147  Diastolic BP 70 96 90  Wt. (Lbs) 208.6 211.5 224  BMI 27.52 27.9 30.38    Physical Exam  Constitutional: He is oriented to person, place, and time. He appears well-developed. No distress.  Cardiovascular: Normal rate and regular rhythm.   No murmur heard. Pulmonary/Chest: Effort normal. He has no wheezes. He has no rales.  Neurological: He is alert and oriented to person, place, and time.  Psychiatric: He has a normal mood and affect.  Vitals reviewed.   Lab Results  Component Value Date   WBC 10.4 06/28/2015   HGB 16.0 06/28/2015   HCT 48.3 06/28/2015   PLT 277.0 06/28/2015   GLUCOSE 93 11/06/2016   CHOL 104 06/26/2016   TRIG 105.0 06/26/2016     HDL 26.30 (L) 06/26/2016   LDLDIRECT 85.0 12/27/2015   LDLCALC 57 06/26/2016   ALT 29 06/26/2016   AST 23 06/26/2016   NA 137 11/06/2016   K 4.3 11/06/2016   CL 101 11/06/2016   CREATININE 0.93 11/06/2016   BUN 12 11/06/2016   CO2 25 11/06/2016   PSA 0.26 06/28/2015   HGBA1C 6.4 10/27/2016   MICROALBUR 5.0 (H) 06/28/2015    Assessment & Plan:   Problem List Items Addressed This Visit    Diabetes mellitus type 2, uncomplicated (HCC)    At goal.  Given symptomatic hypoglycemia, stopping Amaryl. Continue metformin, Jardiance, Onglyza.      Essential hypertension    Stable on Lotrel.      Hyperlipidemia    At goal on lipitor.        Follow-up: 3 months  Claude Swendsen Adriana Simasook DO [facility] [place]

## 2017-01-29 NOTE — Assessment & Plan Note (Addendum)
At goal.  Given symptomatic hypoglycemia, stopping Amaryl. Continue metformin, Jardiance, Onglyza.

## 2017-02-23 ENCOUNTER — Encounter: Payer: Self-pay | Admitting: Family Medicine

## 2017-02-28 ENCOUNTER — Other Ambulatory Visit: Payer: Self-pay | Admitting: Family Medicine

## 2017-02-28 DIAGNOSIS — E119 Type 2 diabetes mellitus without complications: Secondary | ICD-10-CM

## 2017-03-01 ENCOUNTER — Other Ambulatory Visit: Payer: Self-pay | Admitting: Family Medicine

## 2017-03-07 ENCOUNTER — Other Ambulatory Visit: Payer: BLUE CROSS/BLUE SHIELD

## 2017-03-07 DIAGNOSIS — E119 Type 2 diabetes mellitus without complications: Secondary | ICD-10-CM

## 2017-03-07 LAB — MICROALBUMIN / CREATININE URINE RATIO
Creatinine,U: 47.7 mg/dL
MICROALB/CREAT RATIO: 5.6 mg/g (ref 0.0–30.0)
Microalb, Ur: 2.7 mg/dL — ABNORMAL HIGH (ref 0.0–1.9)

## 2017-03-26 ENCOUNTER — Other Ambulatory Visit: Payer: Self-pay | Admitting: Family Medicine

## 2017-03-31 ENCOUNTER — Other Ambulatory Visit: Payer: Self-pay | Admitting: Family Medicine

## 2017-04-28 ENCOUNTER — Other Ambulatory Visit: Payer: Self-pay | Admitting: Family Medicine

## 2017-06-06 ENCOUNTER — Other Ambulatory Visit: Payer: Self-pay | Admitting: Family Medicine

## 2017-06-06 NOTE — Telephone Encounter (Signed)
Last OV 01/26/17 with Dr.Cook last filled 05/02/17 75 0rf by Elise BenneBrock Booth

## 2017-06-06 NOTE — Telephone Encounter (Signed)
Refill sent to pharmacy. Patient needs follow-up. Thanks. 

## 2017-06-12 ENCOUNTER — Other Ambulatory Visit: Payer: Self-pay

## 2017-06-12 NOTE — Telephone Encounter (Signed)
Last OV with Dr.Cook 01/26/17 last filled 06/26/16 90 1rf

## 2017-06-14 MED ORDER — SAXAGLIPTIN HCL 5 MG PO TABS: 5.0000 mg | ORAL_TABLET | Freq: Every day | ORAL | 1 refills | Status: DC

## 2017-06-14 NOTE — Telephone Encounter (Signed)
Refill sent to pharmacy.  Patient needs a visit to establish care.  Thanks.

## 2017-06-15 NOTE — Telephone Encounter (Signed)
scheduled

## 2017-06-18 ENCOUNTER — Encounter: Payer: Self-pay | Admitting: Family Medicine

## 2017-06-18 ENCOUNTER — Telehealth: Payer: Self-pay | Admitting: Family Medicine

## 2017-06-18 NOTE — Telephone Encounter (Signed)
Received insurance card, will start prior auth

## 2017-06-18 NOTE — Telephone Encounter (Signed)
Left message to return call, I have completed a [rior authorization but they stated he is not covered by insurance that is scanned in chart need a copy of insurance card. Ok for pec to speak to patient

## 2017-06-18 NOTE — Telephone Encounter (Signed)
Copied from CRM [id number]. Topic: Quick Communication - See Telephone Encounter >> Jun 18, 2017 12:04 PM Arlyss Gandyichardson, Nashla Althoff N, NT wrote: CRM for notification. See Telephone encounter for: Pt states his insurance company is needing prior auth for  saxagliptin HCl (ONGLYZA).  06/18/17.

## 2017-06-18 NOTE — Telephone Encounter (Signed)
Please advise 

## 2017-07-05 ENCOUNTER — Other Ambulatory Visit: Payer: Self-pay | Admitting: Family Medicine

## 2017-07-10 ENCOUNTER — Other Ambulatory Visit: Payer: Self-pay

## 2017-07-10 MED ORDER — EMPAGLIFLOZIN 10 MG PO TABS: 10.0000 mg | ORAL_TABLET | Freq: Every day | ORAL | 1 refills | Status: DC

## 2017-07-10 NOTE — Telephone Encounter (Signed)
Last OV 01/26/17 with Dr.Cook patient is scheduled to establish 07/27/17 last filled by Dr.Cook 06/27/16 90 3rf

## 2017-07-10 NOTE — Telephone Encounter (Signed)
Okay to refill? Pt saw cook last & next appt is on 07/28/17.

## 2017-07-10 NOTE — Telephone Encounter (Signed)
There is not a medication attached to this refill encounter.  Please let me know what he needs refilled.  Thanks.

## 2017-07-11 NOTE — Telephone Encounter (Signed)
It was jardiance & it looks as though it you already approved it because there were two messages sent to you. Please disregard.  Thanks

## 2017-07-27 ENCOUNTER — Other Ambulatory Visit: Payer: Self-pay

## 2017-07-27 ENCOUNTER — Ambulatory Visit: Payer: BLUE CROSS/BLUE SHIELD | Admitting: Family Medicine

## 2017-07-27 ENCOUNTER — Encounter: Payer: Self-pay | Admitting: Family Medicine

## 2017-07-27 VITALS — BP 134/78 | HR 85 | Temp 98.0°F | Wt 216.0 lb

## 2017-07-27 DIAGNOSIS — I10 Essential (primary) hypertension: Secondary | ICD-10-CM

## 2017-07-27 DIAGNOSIS — E119 Type 2 diabetes mellitus without complications: Secondary | ICD-10-CM | POA: Diagnosis not present

## 2017-07-27 DIAGNOSIS — E785 Hyperlipidemia, unspecified: Secondary | ICD-10-CM

## 2017-07-27 NOTE — Progress Notes (Signed)
  Tommi Rumps, MD Phone: [phone]  Chad Frye is a 57 y.o. male who presents today for f/u.  HYPERTENSION Disease Monitoring: Blood pressure range-avg 135/80 Chest pain- nio      Dyspnea- no Medications: Compliance- taking amlodipine benazepril   Edema- no  DIABETES Disease Monitoring: Blood Sugar ranges-90-140 Polyuria/phagia/dipsia- no       Medications: Compliance- taking jardiance and metformin, has been off onglyza for one month Hypoglycemic symptoms- no  HYPERLIPIDEMIA Disease Monitoring: See symptoms for Hypertension Medications: Compliance- taking lipitor Right upper quadrant pain- no  Muscle aches- no     Social History   Tobacco Use  Smoking Status Current Every Day Smoker  . Last attempt to quit: 11/28/2015  . Years since quitting: 1.6  Smokeless Tobacco Never Used     ROS see history of present illness  Objective  Physical Exam Vitals:   07/27/17 1542  BP: 134/78  Pulse: 85  Temp: 98 F (36.7 C)  SpO2: 94%    BP Readings from Last 3 Encounters:  07/27/17 134/78  01/26/17 (!) 160/70  10/27/16 (!) 138/96   Wt Readings from Last 3 Encounters:  07/27/17 216 lb (98 kg)  01/26/17 208 lb 9.6 oz (94.6 kg)  10/27/16 211 lb 8 oz (95.9 kg)    Physical Exam  Constitutional: No distress.  Cardiovascular: Normal rate, regular rhythm and normal heart sounds.  Pulmonary/Chest: Effort normal and breath sounds normal.  Musculoskeletal: He exhibits no edema.  Neurological: He is alert. Gait normal.  Skin: Skin is warm and dry. He is not diaphoretic.     Assessment/Plan: Please see individual problem list.  Essential hypertension BP average is not at goal though he has not been checking consistently recently.  We will check daily for the next 2 weeks and contact us with the results.  If not at goal would consider addition of another medication.  I will continue his current regimen.  Diabetes mellitus type 2, uncomplicated ([place]) Seems to  be well-controlled.  Check lab work.  Continue current regimen.  Depending on A1c result may add back on Onglyza.  Hyperlipidemia Check lipid panel.  Continue Lipitor.   Orders Placed This Encounter  Procedures  . HgB A1c  . Lipid panel  . Comp Met (CMET)  . Urine Microalbumin w/creat. ratio    No orders of the defined types were placed in this encounter.    Tommi Rumps, MD [facility]

## 2017-07-27 NOTE — Assessment & Plan Note (Signed)
BP average is not at goal though he has not been checking consistently recently.  We will check daily for the next 2 weeks and contact us with the results.  If not at goal would consider addition of another medication.  I will continue his current regimen.

## 2017-07-27 NOTE — Patient Instructions (Signed)
Nice to see you. We will check lab work today and call you with the results.  Please track your blood pressure daily for 2 weeks and call us with the results.

## 2017-07-27 NOTE — Assessment & Plan Note (Signed)
Check lipid panel.  Continue Lipitor. 

## 2017-07-27 NOTE — Assessment & Plan Note (Signed)
Seems to be well-controlled.  Check lab work.  Continue current regimen.  Depending on A1c result may add back on Onglyza.

## 2017-07-28 ENCOUNTER — Other Ambulatory Visit: Payer: Self-pay | Admitting: Family Medicine

## 2017-07-28 LAB — COMPREHENSIVE METABOLIC PANEL
AG Ratio: 1.8 (calc) (ref 1.0–2.5)
ALKALINE PHOSPHATASE (APISO): 115 U/L (ref 40–115)
ALT: 25 U/L (ref 9–46)
AST: 22 U/L (ref 10–35)
Albumin: 4.7 g/dL (ref 3.6–5.1)
BUN: 15 mg/dL (ref 7–25)
CHLORIDE: 99 mmol/L (ref 98–110)
CO2: 23 mmol/L (ref 20–32)
Calcium: 9.8 mg/dL (ref 8.6–10.3)
Creat: 0.93 mg/dL (ref 0.70–1.33)
GLOBULIN: 2.6 g/dL (ref 1.9–3.7)
Glucose, Bld: 94 mg/dL (ref 65–99)
Potassium: 4.2 mmol/L (ref 3.5–5.3)
Sodium: 136 mmol/L (ref 135–146)
Total Bilirubin: 0.8 mg/dL (ref 0.2–1.2)
Total Protein: 7.3 g/dL (ref 6.1–8.1)

## 2017-07-28 LAB — MICROALBUMIN / CREATININE URINE RATIO
CREATININE, URINE: 64 mg/dL (ref 20–320)
MICROALB UR: 2.6 mg/dL
MICROALB/CREAT RATIO: 41 ug/mg{creat} — AB (ref <30)

## 2017-07-28 LAB — LIPID PANEL
CHOL/HDL RATIO: 5.5 (calc) — AB (ref <5.0)
Cholesterol: 172 mg/dL (ref <200)
HDL: 31 mg/dL — ABNORMAL LOW (ref >40)
LDL CHOLESTEROL (CALC): 113 mg/dL — AB
NON-HDL CHOLESTEROL (CALC): 141 mg/dL — AB (ref <130)
TRIGLYCERIDES: 161 mg/dL — AB (ref <150)

## 2017-07-28 LAB — HEMOGLOBIN A1C
Hgb A1c MFr Bld: 8.1 % of total Hgb — ABNORMAL HIGH (ref <5.7)
Mean Plasma Glucose: 186 (calc)
eAG (mmol/L): 10.3 (calc)

## 2017-07-28 MED ORDER — SAXAGLIPTIN HCL 5 MG PO TABS: 5.0000 mg | ORAL_TABLET | Freq: Every day | ORAL | 1 refills | Status: DC

## 2017-07-31 ENCOUNTER — Other Ambulatory Visit: Payer: Self-pay | Admitting: Family Medicine

## 2017-07-31 MED ORDER — SITAGLIPTIN PHOSPHATE 100 MG PO TABS: 100.0000 mg | ORAL_TABLET | Freq: Every day | ORAL | 5 refills | Status: DC

## 2017-07-31 MED ORDER — ATORVASTATIN CALCIUM 80 MG PO TABS: 80.0000 mg | ORAL_TABLET | Freq: Every day | ORAL | 5 refills | Status: DC

## 2017-08-02 ENCOUNTER — Other Ambulatory Visit: Payer: Self-pay | Admitting: Family Medicine

## 2017-08-02 DIAGNOSIS — R809 Proteinuria, unspecified: Secondary | ICD-10-CM

## 2017-08-10 ENCOUNTER — Ambulatory Visit
Admission: RE | Admit: 2017-08-10 | Discharge: 2017-08-10 | Disposition: A | Discharge status: Home / Self Care | Payer: BLUE CROSS/BLUE SHIELD | Source: Ambulatory Visit | Attending: Family Medicine | Admitting: Family Medicine

## 2017-08-10 DIAGNOSIS — R809 Proteinuria, unspecified: Secondary | ICD-10-CM

## 2017-08-13 ENCOUNTER — Telehealth: Payer: Self-pay | Admitting: Family Medicine

## 2017-08-13 DIAGNOSIS — I10 Essential (primary) hypertension: Secondary | ICD-10-CM

## 2017-08-13 NOTE — Telephone Encounter (Signed)
His blood pressure is not at goal.  I think the option is adding an additional blood pressure medication.  I would like to add hydrochlorothiazide.  He would need lab work 1 month after going on this.  If he is willing I can send this to his pharmacy.  Thanks.

## 2017-08-13 NOTE — Telephone Encounter (Signed)
Patient called to get his results- he states he was to call with BP readings- highest reading- 151/96 ( 1 week ago) Lowest reading 138/84  Patient would prefer to change medications rather than add to if that is an option.

## 2017-08-13 NOTE — Telephone Encounter (Signed)
fyi

## 2017-08-14 MED ORDER — HYDROCHLOROTHIAZIDE 12.5 MG PO TABS: 12.5000 mg | ORAL_TABLET | Freq: Every day | ORAL | 3 refills | Status: DC

## 2017-08-14 NOTE — Telephone Encounter (Signed)
Sent to pharmacy. Lab orders placed. 

## 2017-08-14 NOTE — Addendum Note (Signed)
Addended by: Birdie SonsSONNENBERG, Halcyon Heck G on: 08/14/2017 01:00 PM   Modules accepted: Orders

## 2017-08-14 NOTE — Telephone Encounter (Signed)
Patient states he is ok with adding this. Please send to [facility]. Patient is scheduled please place orders

## 2017-09-14 ENCOUNTER — Other Ambulatory Visit (INDEPENDENT_AMBULATORY_CARE_PROVIDER_SITE_OTHER): Payer: BLUE CROSS/BLUE SHIELD

## 2017-09-14 DIAGNOSIS — I10 Essential (primary) hypertension: Secondary | ICD-10-CM

## 2017-09-14 NOTE — Addendum Note (Signed)
Addended by: Warden FillersWRIGHT, LATOYA S on: 09/14/2017 08:06 AM   Modules accepted: Orders

## 2017-09-15 LAB — BASIC METABOLIC PANEL
BUN: 13 mg/dL (ref 7–25)
CALCIUM: 9.6 mg/dL (ref 8.6–10.3)
CO2: 23 mmol/L (ref 20–32)
Chloride: 99 mmol/L (ref 98–110)
Creat: 0.93 mg/dL (ref 0.70–1.33)
GLUCOSE: 85 mg/dL (ref 65–99)
Potassium: 4.1 mmol/L (ref 3.5–5.3)
Sodium: 136 mmol/L (ref 135–146)

## 2017-11-02 ENCOUNTER — Ambulatory Visit: Payer: BLUE CROSS/BLUE SHIELD | Admitting: Family Medicine

## 2018-01-01 ENCOUNTER — Encounter: Payer: Self-pay | Admitting: Family Medicine

## 2018-01-09 DIAGNOSIS — E119 Type 2 diabetes mellitus without complications: Secondary | ICD-10-CM | POA: Diagnosis not present

## 2018-01-09 LAB — HM DIABETES EYE EXAM

## 2018-01-11 ENCOUNTER — Encounter: Payer: Self-pay | Admitting: Family Medicine

## 2018-01-14 ENCOUNTER — Encounter: Payer: Self-pay | Admitting: Family Medicine

## 2018-01-14 ENCOUNTER — Ambulatory Visit: Payer: BLUE CROSS/BLUE SHIELD | Admitting: Family Medicine

## 2018-01-14 VITALS — BP 146/86 | HR 90 | Temp 98.0°F | Wt 206.4 lb

## 2018-01-14 DIAGNOSIS — E785 Hyperlipidemia, unspecified: Secondary | ICD-10-CM | POA: Diagnosis not present

## 2018-01-14 DIAGNOSIS — E119 Type 2 diabetes mellitus without complications: Secondary | ICD-10-CM

## 2018-01-14 DIAGNOSIS — I10 Essential (primary) hypertension: Secondary | ICD-10-CM | POA: Diagnosis not present

## 2018-01-14 DIAGNOSIS — R809 Proteinuria, unspecified: Secondary | ICD-10-CM

## 2018-01-14 LAB — BASIC METABOLIC PANEL
BUN: 17 mg/dL (ref 6–23)
CO2: 30 meq/L (ref 19–32)
Calcium: 10 mg/dL (ref 8.4–10.5)
Chloride: 100 mEq/L (ref 96–112)
Creatinine, Ser: 0.99 mg/dL (ref 0.40–1.50)
GFR: 83.13 mL/min (ref >60.00)
GLUCOSE: 119 mg/dL — AB (ref 70–99)
Potassium: 4.5 mEq/L (ref 3.5–5.1)
SODIUM: 137 meq/L (ref 135–145)

## 2018-01-14 LAB — HEMOGLOBIN A1C: Hgb A1c MFr Bld: 7.8 % — ABNORMAL HIGH (ref 4.6–6.5)

## 2018-01-14 LAB — MICROALBUMIN / CREATININE URINE RATIO
CREATININE, U: 58.5 mg/dL
MICROALB UR: 4.1 mg/dL — AB (ref 0.0–1.9)
MICROALB/CREAT RATIO: 7 mg/g (ref 0.0–30.0)

## 2018-01-14 NOTE — Assessment & Plan Note (Signed)
Continue current medications. Check A1c. 

## 2018-01-14 NOTE — Assessment & Plan Note (Signed)
Suspect related to type to diabetes.  We will recheck this today and then consider referral to nephrology.

## 2018-01-14 NOTE — Progress Notes (Signed)
  Marikay AlarEric Sonnenberg, MD Phone: [phone]  [name] is a 57 y.o. male who presents today for f/u.  CC: htn, dm, hld, proteinuria  HYPERTENSION Disease Monitoring: Blood pressure range-123-150/84 Chest pain- no      Dyspnea- no Medications: Compliance- taking amlodipine, benazepril, HCTZ   Edema- no  DIABETES Disease Monitoring: Blood Sugar ranges-not checking Polyuria/phagia/dipsia- no      Optho- UTD, requesting records Medications: Compliance- taking januvia, metformin Hypoglycemic symptoms- rare, does not check when this occurs.  He will feel little lightheaded.  He will eat and feel better.   HYPERLIPIDEMIA Disease Monitoring: See symptoms for Hypertension Medications: Compliance-taking Lipitor right upper quadrant pain-no muscle aches-no  Noted to have microalbuminuria previously.  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  Due for recheck.     Social History   Tobacco Use  Smoking Status Current Every Day Smoker  . Last attempt to quit: 11/28/2015  . Years since quitting: 2.1  Smokeless Tobacco Never Used     ROS see history of present illness  Objective  Physical Exam Vitals:   01/14/18 1322  BP: (!) 146/86  Pulse: 90  Temp: 98 F (36.7 C)  SpO2: 95%    BP Readings from Last 3 Encounters:  01/14/18 (!) 146/86  07/27/17 134/78  01/26/17 (!) 160/70   Wt Readings from Last 3 Encounters:  01/14/18 206 lb 6.4 oz (93.6 kg)  07/27/17 216 lb (98 kg)  01/26/17 208 lb 9.6 oz (94.6 kg)    Physical Exam  Constitutional: No distress.  Cardiovascular: Normal rate, regular rhythm and normal heart sounds.  Pulmonary/Chest: Effort normal and breath sounds normal.  Musculoskeletal: He exhibits no edema.  Neurological: He is alert.  Skin: Skin is warm and dry. He is not diaphoretic.   Diabetic Foot Exam - Simple   Simple Foot Form Diabetic Foot exam was performed with the following findings:  Yes 01/14/2018  1:59 PM  Visual Inspection See  comments:  Yes Sensation Testing Intact to touch and monofilament testing bilaterally:  Yes Pulse Check Posterior Tibialis and Dorsalis pulse intact bilaterally:  Yes Comments Patient has an abrasion over the top of his left foot that occurred yesterday while tubing, it has scabbed, minimal erythema immediately surrounding the scab that has not worsened since the injury occurred, there is no induration or warmth or tenderness, no other abnormalities noted      Assessment/Plan: Please see individual problem list.  Essential hypertension Not at goal.  We will check a BMP and then likely increase his HCTZ.  Diabetes mellitus type 2, uncomplicated (HCC) Continue current medications.  Check A1c.  Hyperlipidemia Continue Lipitor.  Microalbuminuria Suspect related to type to diabetes.  We will recheck this today and then consider referral to nephrology.   Orders Placed This Encounter  Procedures  . Urine Microalbumin w/creat. ratio  . Basic Metabolic Panel (BMET)  . HgB A1c    No orders of the defined types were placed in this encounter.    Marikay AlarEric Sonnenberg, MD [facility] [place] [facility]

## 2018-01-14 NOTE — Assessment & Plan Note (Signed)
Not at goal.  We will check a BMP and then likely increase his HCTZ.

## 2018-01-14 NOTE — Patient Instructions (Addendum)
Nice to see you. We will get lab work today and then contact you with the results. We will likely increase your hydrochlorothiazide unless your labs dictate otherwise. We will contact you with your urine studies as well.

## 2018-01-14 NOTE — Assessment & Plan Note (Signed)
-  Continue Lipitor °

## 2018-01-17 ENCOUNTER — Other Ambulatory Visit: Payer: Self-pay | Admitting: Family Medicine

## 2018-01-19 ENCOUNTER — Other Ambulatory Visit: Payer: Self-pay | Admitting: Family Medicine

## 2018-01-19 MED ORDER — EMPAGLIFLOZIN 25 MG PO TABS: 25.0000 mg | ORAL_TABLET | Freq: Every day | ORAL | 3 refills | Status: DC

## 2018-02-01 ENCOUNTER — Other Ambulatory Visit: Payer: Self-pay | Admitting: Family Medicine

## 2018-02-14 ENCOUNTER — Telehealth: Payer: Self-pay | Admitting: Radiology

## 2018-02-14 DIAGNOSIS — I10 Essential (primary) hypertension: Secondary | ICD-10-CM

## 2018-02-14 NOTE — Telephone Encounter (Signed)
Ordered

## 2018-02-14 NOTE — Addendum Note (Signed)
Addended by: Birdie Sons, Zavannah Deblois G on: 02/14/2018 12:02 PM   Modules accepted: Orders

## 2018-02-14 NOTE — Telephone Encounter (Signed)
Pt coming in for labs tomorrow, please place future orders. Thank you.  

## 2018-02-15 ENCOUNTER — Other Ambulatory Visit: Payer: BLUE CROSS/BLUE SHIELD

## 2018-02-15 ENCOUNTER — Ambulatory Visit (INDEPENDENT_AMBULATORY_CARE_PROVIDER_SITE_OTHER): Payer: BLUE CROSS/BLUE SHIELD | Admitting: *Deleted

## 2018-02-15 DIAGNOSIS — I10 Essential (primary) hypertension: Secondary | ICD-10-CM

## 2018-02-15 LAB — BASIC METABOLIC PANEL
BUN: 16 mg/dL (ref 7–25)
CHLORIDE: 100 mmol/L (ref 98–110)
CO2: 26 mmol/L (ref 20–32)
Calcium: 9.9 mg/dL (ref 8.6–10.3)
Creat: 0.92 mg/dL (ref 0.70–1.33)
GLUCOSE: 109 mg/dL — AB (ref 65–99)
Potassium: 4.5 mmol/L (ref 3.5–5.3)
Sodium: 136 mmol/L (ref 135–146)

## 2018-02-15 NOTE — Addendum Note (Signed)
Addended by: Penne Lash on: 02/15/2018 01:51 PM   Modules accepted: Orders

## 2018-02-15 NOTE — Progress Notes (Signed)
Patient here for nurse visit BP check per order from 01/14/18  Patient reports compliance with prescribed BP medications: yes , Left arm BP 124/78 pulse 85, right arm 126/76, pulse 81  Last dose of BP medication: 3:30 AM  BP Readings from Last 3 Encounters:  02/15/18 124/78  01/14/18 (!) 146/86  07/27/17 134/78   Pulse Readings from Last 3 Encounters:  02/15/18 85  01/14/18 90  07/27/17 85         Henrene Pastor, LPN

## 2018-02-15 NOTE — Progress Notes (Signed)
Patient's blood pressure is adequately controlled.  He should continue with his current regimen.

## 2018-02-18 DIAGNOSIS — C44622 Squamous cell carcinoma of skin of right upper limb, including shoulder: Secondary | ICD-10-CM | POA: Diagnosis not present

## 2018-02-18 DIAGNOSIS — L118 Other specified acantholytic disorders: Secondary | ICD-10-CM | POA: Diagnosis not present

## 2018-02-18 DIAGNOSIS — X32XXXA Exposure to sunlight, initial encounter: Secondary | ICD-10-CM | POA: Diagnosis not present

## 2018-02-18 DIAGNOSIS — L728 Other follicular cysts of the skin and subcutaneous tissue: Secondary | ICD-10-CM | POA: Diagnosis not present

## 2018-02-18 DIAGNOSIS — L821 Other seborrheic keratosis: Secondary | ICD-10-CM | POA: Diagnosis not present

## 2018-02-18 DIAGNOSIS — D0462 Carcinoma in situ of skin of left upper limb, including shoulder: Secondary | ICD-10-CM | POA: Diagnosis not present

## 2018-02-18 DIAGNOSIS — L570 Actinic keratosis: Secondary | ICD-10-CM | POA: Diagnosis not present

## 2018-02-18 DIAGNOSIS — D485 Neoplasm of uncertain behavior of skin: Secondary | ICD-10-CM | POA: Diagnosis not present

## 2018-02-19 NOTE — Progress Notes (Signed)
Patient notified and voiced understanding.

## 2018-02-21 ENCOUNTER — Other Ambulatory Visit: Payer: Self-pay | Admitting: Family Medicine

## 2018-02-26 ENCOUNTER — Other Ambulatory Visit: Payer: Self-pay | Admitting: Family Medicine

## 2018-03-04 DIAGNOSIS — L720 Epidermal cyst: Secondary | ICD-10-CM | POA: Diagnosis not present

## 2018-03-04 DIAGNOSIS — L298 Other pruritus: Secondary | ICD-10-CM | POA: Diagnosis not present

## 2018-03-04 DIAGNOSIS — L723 Sebaceous cyst: Secondary | ICD-10-CM | POA: Diagnosis not present

## 2018-03-18 DIAGNOSIS — X32XXXA Exposure to sunlight, initial encounter: Secondary | ICD-10-CM | POA: Diagnosis not present

## 2018-03-18 DIAGNOSIS — D0462 Carcinoma in situ of skin of left upper limb, including shoulder: Secondary | ICD-10-CM | POA: Diagnosis not present

## 2018-03-18 DIAGNOSIS — L570 Actinic keratosis: Secondary | ICD-10-CM | POA: Diagnosis not present

## 2018-03-18 DIAGNOSIS — D0461 Carcinoma in situ of skin of right upper limb, including shoulder: Secondary | ICD-10-CM | POA: Diagnosis not present

## 2018-03-22 ENCOUNTER — Other Ambulatory Visit: Payer: Self-pay | Admitting: Family Medicine

## 2018-04-16 ENCOUNTER — Ambulatory Visit: Payer: BLUE CROSS/BLUE SHIELD | Admitting: Family Medicine

## 2018-04-22 ENCOUNTER — Other Ambulatory Visit: Payer: Self-pay | Admitting: Family Medicine

## 2018-05-06 ENCOUNTER — Telehealth: Payer: Self-pay | Admitting: *Deleted

## 2018-05-06 NOTE — Telephone Encounter (Signed)
Called and notified patient.

## 2018-05-06 NOTE — Telephone Encounter (Signed)
Copied from CRM #[id number]. Topic: Complaint - Billing/Coding >> May 03, 2018  4:01 PM McGee, Demi B, NT wrote: DOS: 02/15/18 Details of complaint: Patient wife calling and states that the patient received a bill for $65. Was told at the appointment that there was no copay because he was to get labs drawn and to get a blood pressure check with the nurse.  How would the patient like to see this issue resolved? Would like to know why they received a $65 bill for labs and blood pressure check.   Will automatically be routed to [facility] Coding pool. >> May 05, 2018  4:55 PM Barnes, Amy H wrote: Please send request to the office. [facility] coding is unable to help with this question.  

## 2018-05-06 NOTE — Telephone Encounter (Signed)
Copied from CRM [id number]. Topic: Complaint - Billing/Coding >> May 03, 2018  4:01 PM Mickel BaasMcGee, Demi B, [place] wrote: DOS: 02/15/18 Details of complaint: Patient wife calling and states that the patient received a bill for $65. Was told at the appointment that there was no copay because he was to get labs drawn and to get a blood pressure check with the nurse.  How would the patient like to see this issue resolved? Would like to know why they received a $65 bill for labs and blood pressure check.   [facility]. >> May 05, 2018  4:55 PM Zachery DauerBarnes, Virginiamy H wrote: Please send request to the office. [facility] coding is unable to help with this question.

## 2018-05-06 NOTE — Telephone Encounter (Signed)
The patient needs to call patient accounting [phone].

## 2018-05-23 ENCOUNTER — Other Ambulatory Visit: Payer: Self-pay | Admitting: Family Medicine

## 2018-05-26 ENCOUNTER — Encounter: Payer: Self-pay | Admitting: Family Medicine

## 2018-05-27 ENCOUNTER — Other Ambulatory Visit: Payer: Self-pay

## 2018-05-27 MED ORDER — ATORVASTATIN CALCIUM 80 MG PO TABS: 80.0000 mg | ORAL_TABLET | Freq: Every day | ORAL | 11 refills | Status: DC

## 2018-05-29 ENCOUNTER — Other Ambulatory Visit: Payer: Self-pay | Admitting: Family Medicine

## 2018-06-19 ENCOUNTER — Other Ambulatory Visit: Payer: Self-pay | Admitting: Family Medicine

## 2018-06-28 ENCOUNTER — Other Ambulatory Visit: Payer: Self-pay | Admitting: Family Medicine

## 2018-07-04 ENCOUNTER — Encounter: Payer: Self-pay | Admitting: Family Medicine

## 2018-07-05 ENCOUNTER — Other Ambulatory Visit: Payer: Self-pay

## 2018-07-05 MED ORDER — EMPAGLIFLOZIN 25 MG PO TABS: 25.0000 mg | ORAL_TABLET | Freq: Every day | ORAL | 3 refills | Status: DC

## 2018-07-21 ENCOUNTER — Other Ambulatory Visit: Payer: Self-pay | Admitting: Family Medicine

## 2018-08-12 NOTE — Telephone Encounter (Signed)
Lm on patients vm to call office and schedule appt. °

## 2018-08-29 ENCOUNTER — Other Ambulatory Visit: Payer: Self-pay | Admitting: Family Medicine

## 2018-09-02 ENCOUNTER — Other Ambulatory Visit: Payer: Self-pay | Admitting: Family Medicine

## 2018-09-03 ENCOUNTER — Other Ambulatory Visit: Payer: Self-pay | Admitting: Family Medicine

## 2018-09-29 ENCOUNTER — Other Ambulatory Visit: Payer: Self-pay | Admitting: Family Medicine

## 2018-10-09 ENCOUNTER — Encounter: Payer: Self-pay | Admitting: Family Medicine

## 2018-10-09 NOTE — Telephone Encounter (Signed)
Sounds good to me  Thanks!  LG

## 2018-10-16 ENCOUNTER — Other Ambulatory Visit: Payer: Self-pay

## 2018-10-16 ENCOUNTER — Encounter: Payer: Self-pay | Admitting: Family Medicine

## 2018-10-16 ENCOUNTER — Ambulatory Visit (INDEPENDENT_AMBULATORY_CARE_PROVIDER_SITE_OTHER): Payer: BLUE CROSS/BLUE SHIELD | Admitting: Family Medicine

## 2018-10-16 ENCOUNTER — Telehealth: Payer: Self-pay | Admitting: Family Medicine

## 2018-10-16 DIAGNOSIS — I10 Essential (primary) hypertension: Secondary | ICD-10-CM | POA: Diagnosis not present

## 2018-10-16 DIAGNOSIS — E119 Type 2 diabetes mellitus without complications: Secondary | ICD-10-CM

## 2018-10-16 DIAGNOSIS — E785 Hyperlipidemia, unspecified: Secondary | ICD-10-CM | POA: Diagnosis not present

## 2018-10-16 NOTE — Telephone Encounter (Signed)
Please call to get set up for fasting lab appt, lab orders in  Thanks  LG

## 2018-10-16 NOTE — Progress Notes (Signed)
Patient ID: Chad Frye, male   DOB: Mar 13, 1961, 58 y.o.   MRN: [id number]    Virtual Visit via video Note  This visit type was conducted due to national recommendations for restrictions regarding the COVID-19 pandemic (e.g. social distancing).  This format is felt to be most appropriate for this patient at this time.  All issues noted in this document were discussed and addressed.  No physical exam was performed (except for noted visual exam findings with Video Visits).   I connected with Murrell Converse today at 10:00 AM EDT by a video enabled telemedicine application and verified that I am speaking with the correct person using two identifiers. Location patient: home Location provider: [facility] Persons participating in the virtual visit: patient, provider  I discussed the limitations, risks, security and privacy concerns of performing an evaluation and management service by video and the availability of in person appointments. I also discussed with the patient that there may be a patient responsible charge related to this service. The patient expressed understanding and agreed to proceed.  HPI:  Patient and I connected via video so we can follow-up on his diabetes, BP and cholesterol.  He is feeling well on current diabetes medications.  Tolerating the Metformin, Jardiance and Januvia without any problems.  Checks his blood sugar between 3-5 times a week.  Usually his numbers range between 90-120.  Denies any issues with hypoglycemia.  Patient tolerating his atorvastatin without any problems.  Denies any issues with abdominal pain, muscle aches.  Patient usually checks blood pressure about 1-2 times a week.  Usual [place] will run in the 120s over 70s, on occasion after having a stressful day at work he will be in the 130s over 45s.  No fever or chills.  No chest pain or palpitations.  No lower extremity swelling.  No shortness of breath or wheezing.  No GI or GU complaints.   ROS:  See pertinent positives and negatives per HPI.  Past Medical History:  Diagnosis Date   DM type 2 (diabetes mellitus, type 2) ([place])    Essential hypertension    Hyperlipidemia    Seasonal allergies     Past Surgical History:  Procedure Laterality Date   APPENDECTOMY     VASECTOMY      Family History  Problem Relation Age of Onset   Diabetes Mother    Heart disease Mother    Stroke Father    Prostate cancer Father    Hypertension Father    COPD Sister    Heart disease Brother        single heart bypass   Hypertension Maternal Grandmother    Social History   Tobacco Use   Smoking status: Current Every Day Smoker    Last attempt to quit: 11/28/2015    Years since quitting: 2.8   Smokeless tobacco: Never Used  Substance Use Topics   Alcohol use: Yes    Alcohol/week: 4.0 standard drinks    Types: 4 Standard drinks or equivalent per week     Current Outpatient Medications:    amLODipine-benazepril (LOTREL) 10-40 MG capsule, TAKE 1 CAPSULE BY MOUTH EVERY DAY, Disp: 30 capsule, Rfl: 0   aspirin 81 MG tablet, Take 1 tablet by mouth daily., Disp: , Rfl:    atorvastatin (LIPITOR) 80 MG tablet, Take 1 tablet (80 mg total) by mouth daily., Disp: 30 tablet, Rfl: 11   b complex vitamins tablet, Take 1 tablet by mouth daily., Disp: , Rfl:  blood glucose meter kit and supplies KIT, Dispense based on patient and insurance preference. Use up to four times daily as directed. (FOR ICD-9 250.00, 250.01)., Disp: 1 each, Rfl: 0   Cinnamon 500 MG TABS, Take 8 tablets by mouth daily., Disp: , Rfl:    empagliflozin (JARDIANCE) 25 MG TABS tablet, Take 25 mg by mouth daily., Disp: 30 tablet, Rfl: 3   hydrochlorothiazide (HYDRODIURIL) 12.5 MG tablet, TAKE 1 TABLET BY MOUTH DAILY, Disp: 30 tablet, Rfl: 9   JANUVIA 100 MG tablet, TAKE 1 TABLET BY MOUTH EVERY DAY, Disp: 30 tablet, Rfl: 2   metFORMIN (GLUCOPHAGE) 1000 MG tablet, TAKE 1 TABLET BY MOUTH EVERY MORNING AND  1.5 TABS EVERY EVENING, Disp: 75 tablet, Rfl: 1   MULTIPLE VITAMIN PO, Take 1 tablet by mouth daily., Disp: , Rfl:   EXAM:  GENERAL: alert, oriented, appears well and in no acute distress  HEENT: atraumatic, conjunttiva clear, no obvious abnormalities on inspection of external nose and ears  NECK: normal movements of the head and neck  LUNGS: on inspection no signs of respiratory distress, breathing rate appears normal, no obvious gross SOB, gasping or wheezing  CV: no obvious cyanosis  MS: moves all visible extremities without noticeable abnormality  PSYCH/NEURO: pleasant and cooperative, no obvious depression or anxiety, speech and thought processing grossly intact  ASSESSMENT AND PLAN:  Discussed the following assessment and plan:  Essential hypertension - Plan: CBC w/Diff, Comp Met (CMET), Lipid panel, Urine Microalbumin w/creat. ratio  Type 2 diabetes mellitus without complication, without long-term current use of insulin (HCC) - Plan: CBC w/Diff, Comp Met (CMET), HgB A1c, Lipid panel, TSH, Urine Microalbumin w/creat. ratio  Hyperlipidemia, unspecified hyperlipidemia type - Plan: Lipid panel, TSH  Patient does not require any refills today.  He will continue current BP medications as they seem to be working quite well.  He will continue atorvastatin.  He will continue Januvia, metformin and Jardiance.  We will plan to have patient come into office to get fasting lab work to follow-up on A1c, lipid panel, CBC & metabolic panel.   I discussed the assessment and treatment plan with the patient. The patient was provided an opportunity to ask questions and all were answered. The patient agreed with the plan and demonstrated an understanding of the instructions.   The patient was advised to call back or seek an in-person evaluation if the symptoms worsen or if the condition fails to improve as anticipated.  Jodelle Green, FNP

## 2018-10-17 ENCOUNTER — Other Ambulatory Visit: Payer: Self-pay | Admitting: Family Medicine

## 2018-10-18 NOTE — Telephone Encounter (Signed)
Called Pt NO answer left a VM. Will try back later

## 2018-10-22 ENCOUNTER — Telehealth: Payer: Self-pay

## 2018-10-22 NOTE — Telephone Encounter (Signed)
Copied from CRM [id number]. Topic: Appointment Scheduling - Scheduling Inquiry for Clinic >> Oct 22, 2018 10:53 AM Fanny Bien wrote: Pt called and stated that he would like to schedule lab appointment

## 2018-10-28 ENCOUNTER — Other Ambulatory Visit: Payer: Self-pay | Admitting: Family Medicine

## 2018-11-11 ENCOUNTER — Other Ambulatory Visit: Payer: BLUE CROSS/BLUE SHIELD

## 2018-11-21 ENCOUNTER — Other Ambulatory Visit: Payer: BC Managed Care – PPO

## 2018-11-26 ENCOUNTER — Other Ambulatory Visit: Payer: Self-pay

## 2018-11-26 ENCOUNTER — Other Ambulatory Visit (INDEPENDENT_AMBULATORY_CARE_PROVIDER_SITE_OTHER): Payer: BC Managed Care – PPO

## 2018-11-26 DIAGNOSIS — I10 Essential (primary) hypertension: Secondary | ICD-10-CM

## 2018-11-26 DIAGNOSIS — E785 Hyperlipidemia, unspecified: Secondary | ICD-10-CM | POA: Diagnosis not present

## 2018-11-26 DIAGNOSIS — E119 Type 2 diabetes mellitus without complications: Secondary | ICD-10-CM | POA: Diagnosis not present

## 2018-11-26 LAB — CBC WITH DIFFERENTIAL/PLATELET
Basophils Absolute: 0.1 10*3/uL (ref 0.0–0.1)
Basophils Relative: 0.5 % (ref 0.0–3.0)
Eosinophils Absolute: 0.1 10*3/uL (ref 0.0–0.7)
Eosinophils Relative: 1 % (ref 0.0–5.0)
HCT: 47.9 % (ref 39.0–52.0)
Hemoglobin: 15.8 g/dL (ref 13.0–17.0)
Lymphocytes Relative: 15.8 % (ref 12.0–46.0)
Lymphs Abs: 1.9 10*3/uL (ref 0.7–4.0)
MCHC: 33 g/dL (ref 30.0–36.0)
MCV: 89.8 fl (ref 78.0–100.0)
Monocytes Absolute: 1.1 10*3/uL — ABNORMAL HIGH (ref 0.1–1.0)
Monocytes Relative: 8.6 % (ref 3.0–12.0)
Neutro Abs: 9.1 10*3/uL — ABNORMAL HIGH (ref 1.4–7.7)
Neutrophils Relative %: 74.1 % (ref 43.0–77.0)
Platelets: 325 10*3/uL (ref 150.0–400.0)
RBC: 5.34 Mil/uL (ref 4.22–5.81)
RDW: 13.6 % (ref 11.5–15.5)
WBC: 12.3 10*3/uL — ABNORMAL HIGH (ref 4.0–10.5)

## 2018-11-26 LAB — MICROALBUMIN / CREATININE URINE RATIO
Creatinine,U: 52.9 mg/dL
Microalb Creat Ratio: 3.1 mg/g (ref 0.0–30.0)
Microalb, Ur: 1.7 mg/dL (ref 0.0–1.9)

## 2018-11-26 LAB — COMPREHENSIVE METABOLIC PANEL
ALT: 22 U/L (ref 0–53)
AST: 16 U/L (ref 0–37)
Albumin: 4.6 g/dL (ref 3.5–5.2)
Alkaline Phosphatase: 102 U/L (ref 39–117)
BUN: 20 mg/dL (ref 6–23)
CO2: 24 mEq/L (ref 19–32)
Calcium: 9.8 mg/dL (ref 8.4–10.5)
Chloride: 99 mEq/L (ref 96–112)
Creatinine, Ser: 0.88 mg/dL (ref 0.40–1.50)
GFR: 89.32 mL/min (ref >60.00)
Glucose, Bld: 142 mg/dL — ABNORMAL HIGH (ref 70–99)
Potassium: 4.5 mEq/L (ref 3.5–5.1)
Sodium: 134 mEq/L — ABNORMAL LOW (ref 135–145)
Total Bilirubin: 0.6 mg/dL (ref 0.2–1.2)
Total Protein: 7.2 g/dL (ref 6.0–8.3)

## 2018-11-26 LAB — TSH: TSH: 2 u[IU]/mL (ref 0.35–4.50)

## 2018-11-26 LAB — LIPID PANEL
Cholesterol: 241 mg/dL — ABNORMAL HIGH (ref 0–200)
HDL: 34.9 mg/dL — ABNORMAL LOW (ref >39.00)
NonHDL: 205.91
Total CHOL/HDL Ratio: 7
Triglycerides: 217 mg/dL — ABNORMAL HIGH (ref 0.0–149.0)
VLDL: 43.4 mg/dL — ABNORMAL HIGH (ref 0.0–40.0)

## 2018-11-26 LAB — HEMOGLOBIN A1C: Hgb A1c MFr Bld: 7.5 % — ABNORMAL HIGH (ref 4.6–6.5)

## 2018-11-26 LAB — LDL CHOLESTEROL, DIRECT: Direct LDL: 171 mg/dL

## 2018-11-27 ENCOUNTER — Encounter: Payer: Self-pay | Admitting: Family Medicine

## 2018-11-27 DIAGNOSIS — E119 Type 2 diabetes mellitus without complications: Secondary | ICD-10-CM

## 2018-11-28 ENCOUNTER — Encounter: Payer: Self-pay | Admitting: Family Medicine

## 2018-12-02 ENCOUNTER — Other Ambulatory Visit: Payer: Self-pay | Admitting: Family Medicine

## 2018-12-02 DIAGNOSIS — E785 Hyperlipidemia, unspecified: Secondary | ICD-10-CM

## 2018-12-02 MED ORDER — ROSUVASTATIN CALCIUM 20 MG PO TABS: 20.0000 mg | ORAL_TABLET | Freq: Every day | ORAL | 1 refills | Status: DC

## 2018-12-02 NOTE — Telephone Encounter (Signed)
[name],  I would love your input on this.  Chad Frye is currently on metformin, Januvia and Jardiance for diabetes but still not quite at goal.   I was thinking to put him onTrulicity, but he did bring up the idea of Ozempic as his wife is doing very well on this drug.  Was wondering what you would recommend to put him on and also if there are any sort of coupons or discounts he would qualify for these medications  As always I appreciate your expertise and help!  Sincerely,  Chad Frye

## 2018-12-03 MED ORDER — OZEMPIC (0.25 OR 0.5 MG/DOSE) 2 MG/1.5ML ~~LOC~~ SOPN: PEN_INJECTOR | SUBCUTANEOUS | 3 refills | Status: DC

## 2018-12-03 NOTE — Telephone Encounter (Signed)
Pt scheduled for lab draw 10/6 @ 9:00am

## 2018-12-03 NOTE — Telephone Encounter (Signed)
Thank you Julie

## 2018-12-04 NOTE — Telephone Encounter (Signed)
Lab Appt scheduled for October

## 2018-12-20 ENCOUNTER — Other Ambulatory Visit: Payer: Self-pay | Admitting: Family

## 2018-12-21 ENCOUNTER — Other Ambulatory Visit: Payer: Self-pay | Admitting: Family

## 2018-12-24 ENCOUNTER — Other Ambulatory Visit: Payer: Self-pay

## 2018-12-24 MED ORDER — AMLODIPINE BESY-BENAZEPRIL HCL 10-40 MG PO CAPS: 1.0000 | ORAL_CAPSULE | Freq: Every day | ORAL | 0 refills | Status: DC

## 2019-01-20 ENCOUNTER — Other Ambulatory Visit: Payer: Self-pay | Admitting: Family Medicine

## 2019-01-20 DIAGNOSIS — E119 Type 2 diabetes mellitus without complications: Secondary | ICD-10-CM

## 2019-02-20 ENCOUNTER — Telehealth: Payer: Self-pay | Admitting: *Deleted

## 2019-02-20 ENCOUNTER — Encounter: Payer: Self-pay | Admitting: Family Medicine

## 2019-02-20 DIAGNOSIS — E785 Hyperlipidemia, unspecified: Secondary | ICD-10-CM

## 2019-02-20 DIAGNOSIS — E119 Type 2 diabetes mellitus without complications: Secondary | ICD-10-CM

## 2019-02-20 NOTE — Telephone Encounter (Signed)
Please place future orders for lab appt.  

## 2019-02-20 NOTE — Telephone Encounter (Signed)
Orders in  Recheck a1c and lipids after med changes

## 2019-02-20 NOTE — Telephone Encounter (Signed)
It looks like Chad Frye had this lab appointment scheduled. I will forward to her to see if there was anything that needed to be rechecked other than an A1c and lipid panel. We will also need to arrange for a follow-up appointment for the patient as well.

## 2019-02-21 ENCOUNTER — Telehealth: Payer: Self-pay

## 2019-02-21 NOTE — Telephone Encounter (Signed)
Copied from [place] [id number]. Topic: General - Call Back - No Documentation >> Feb 21, 2019  3:57 PM Chad Frye, NT wrote: Reason for CRM: Patient stated he is returning a call from [place]. Please advise.

## 2019-02-25 ENCOUNTER — Other Ambulatory Visit (INDEPENDENT_AMBULATORY_CARE_PROVIDER_SITE_OTHER): Payer: BC Managed Care – PPO

## 2019-02-25 ENCOUNTER — Other Ambulatory Visit: Payer: Self-pay

## 2019-02-25 DIAGNOSIS — E785 Hyperlipidemia, unspecified: Secondary | ICD-10-CM

## 2019-02-25 DIAGNOSIS — E119 Type 2 diabetes mellitus without complications: Secondary | ICD-10-CM | POA: Diagnosis not present

## 2019-02-25 LAB — LIPID PANEL
Cholesterol: 120 mg/dL (ref 0–200)
HDL: 28.7 mg/dL — ABNORMAL LOW (ref >39.00)
LDL Cholesterol: 61 mg/dL (ref 0–99)
NonHDL: 91.67
Total CHOL/HDL Ratio: 4
Triglycerides: 152 mg/dL — ABNORMAL HIGH (ref 0.0–149.0)
VLDL: 30.4 mg/dL (ref 0.0–40.0)

## 2019-02-25 LAB — HEMOGLOBIN A1C: Hgb A1c MFr Bld: 8.1 % — ABNORMAL HIGH (ref 4.6–6.5)

## 2019-02-26 NOTE — Telephone Encounter (Signed)
Patient was called again regarding labs results today. Message was left to call back.

## 2019-03-04 DIAGNOSIS — E119 Type 2 diabetes mellitus without complications: Secondary | ICD-10-CM | POA: Diagnosis not present

## 2019-03-04 LAB — HM DIABETES EYE EXAM

## 2019-03-05 ENCOUNTER — Encounter: Payer: Self-pay | Admitting: Family Medicine

## 2019-03-05 ENCOUNTER — Other Ambulatory Visit: Payer: Self-pay

## 2019-03-05 ENCOUNTER — Ambulatory Visit (INDEPENDENT_AMBULATORY_CARE_PROVIDER_SITE_OTHER): Payer: BC Managed Care – PPO | Admitting: Family Medicine

## 2019-03-05 DIAGNOSIS — E119 Type 2 diabetes mellitus without complications: Secondary | ICD-10-CM

## 2019-03-05 DIAGNOSIS — I10 Essential (primary) hypertension: Secondary | ICD-10-CM | POA: Diagnosis not present

## 2019-03-05 DIAGNOSIS — R5383 Other fatigue: Secondary | ICD-10-CM | POA: Insufficient documentation

## 2019-03-05 DIAGNOSIS — E785 Hyperlipidemia, unspecified: Secondary | ICD-10-CM

## 2019-03-05 DIAGNOSIS — N529 Male erectile dysfunction, unspecified: Secondary | ICD-10-CM | POA: Diagnosis not present

## 2019-03-05 MED ORDER — SILDENAFIL CITRATE 50 MG PO TABS: 50.0000 mg | ORAL_TABLET | Freq: Every day | ORAL | 0 refills | Status: DC | PRN

## 2019-03-05 MED ORDER — JARDIANCE 10 MG PO TABS: 10.0000 mg | ORAL_TABLET | Freq: Every day | ORAL | 1 refills | Status: DC

## 2019-03-05 NOTE — Assessment & Plan Note (Signed)
Will trial Viagra.  Advised that he needs to let EMS or the ED know that he has taken this within the last 24 to 48 hours if he has to go be evaluated.  Discussed that he should discontinue sexual activity if he develops chest pain or shortness of breath.

## 2019-03-05 NOTE — Assessment & Plan Note (Signed)
We will add Jardiance back.  I discussed that typically when starting Ozempic I discontinue Januvia as the mechanism of action is redundant.  He will start checking his sugars once daily and vary them with fasting and 2 hours postprandial.

## 2019-03-05 NOTE — Progress Notes (Signed)
Virtual Visit via video Note  This visit type was conducted due to national recommendations for restrictions regarding the COVID-19 pandemic (e.g. social distancing).  This format is felt to be most appropriate for this patient at this time.  All issues noted in this document were discussed and addressed.  No physical exam was performed (except for noted visual exam findings with Video Visits).   I connected with Chad Frye today at  4:30 PM EDT by a video enabled telemedicine application and verified that I am speaking with the correct person using two identifiers. Location patient: work Location provider: work  Persons participating in the virtual visit: patient, provider  I discussed the limitations, risks, security and privacy concerns of performing an evaluation and management service by telephone and the availability of in person appointments. I also discussed with the patient that there may be a patient responsible charge related to this service. The patient expressed understanding and agreed to proceed.   Reason for visit: follow-up  HPI: HYPERTENSION Disease Monitoring: Blood pressure range-123/79  Chest pain- no      Dyspnea- no Medications: Compliance- taking amlodipine, benazepril, HCTZ   Edema- no  DIABETES Disease Monitoring: Blood Sugar ranges-90's-120s Polyuria/phagia/dipsia- no       Medications: Compliance-taking Ozempic, Januvia, and metformin hypoglycemic symptoms-no  HYPERLIPIDEMIA Disease Monitoring: See symptoms for Hypertension Medications: Compliance-taking Crestor. Right upper quadrant pain-no. Muscle aches-no.  Tiredness: Patient reports he feels as though he sleeps well and he wakes up well rested though about an hour later he can sit down and take a nap for about 15 or 20 minutes.  No snoring.  No apneic episodes.  He gets 6 to 7 hours of sleep at night.  If he gets 9 hours of sleep he just does not feel like doing anything.  Erectile dysfunction:  Patient notes he is able to attain an erection though has issues maintaining an erection.  He notes he is able to ejaculate.  He wonders if any of his medications are contributing.   ROS: See pertinent positives and negatives per HPI.  Past Medical History:  Diagnosis Date  . DM type 2 (diabetes mellitus, type 2) ([place])   . Essential hypertension   . Hyperlipidemia   . Seasonal allergies     Past Surgical History:  Procedure Laterality Date  . APPENDECTOMY    . VASECTOMY      Family History  Problem Relation Age of Onset  . Diabetes Mother   . Heart disease Mother   . Stroke Father   . Prostate cancer Father   . Hypertension Father   . COPD Sister   . Heart disease Brother        single heart bypass  . Hypertension Maternal Grandmother     SOCIAL HX: Smoker   Current Outpatient Medications:  .  amLODipine-benazepril (LOTREL) 10-40 MG capsule, TAKE 1 CAPSULE BY MOUTH EVERY DAY, Disp: 30 capsule, Rfl: 0 .  amLODipine-benazepril (LOTREL) 10-40 MG capsule, Take 1 capsule by mouth daily., Disp: 90 capsule, Rfl: 0 .  aspirin 81 MG tablet, Take 1 tablet by mouth daily., Disp: , Rfl:  .  b complex vitamins tablet, Take 1 tablet by mouth daily., Disp: , Rfl:  .  blood glucose meter kit and supplies KIT, Dispense based on patient and insurance preference. Use up to four times daily as directed. (FOR ICD-9 250.00, 250.01)., Disp: 1 each, Rfl: 0 .  Cinnamon 500 MG TABS, Take 8 tablets by mouth daily., Disp: ,  Rfl:  .  hydrochlorothiazide (HYDRODIURIL) 12.5 MG tablet, TAKE 1 TABLET BY MOUTH DAILY, Disp: 30 tablet, Rfl: 9 .  metFORMIN (GLUCOPHAGE) 1000 MG tablet, TAKE 1 TABLET BY MOUTH EVERY MORNING AND 1.5 TABS EVERY EVENING, Disp: 75 tablet, Rfl: 1 .  MULTIPLE VITAMIN PO, Take 1 tablet by mouth daily., Disp: , Rfl:  .  OZEMPIC, 0.25 OR 0.5 MG/DOSE, 2 MG/1.5ML SOPN, INJECT 0.25 MG INTO THE SKIN 1X/WEEK X28 DAYS, THEN 0.5 MG 1X/WEEK X28 DAYS, THEN 1 MG ONCE A WEEK, Disp: 1.5 mL, Rfl: 3  .  rosuvastatin (CRESTOR) 20 MG tablet, Take 1 tablet (20 mg total) by mouth daily., Disp: 90 tablet, Rfl: 1 .  empagliflozin (JARDIANCE) 10 MG TABS tablet, Take 10 mg by mouth daily before breakfast., Disp: 90 tablet, Rfl: 1 .  sildenafil (VIAGRA) 50 MG tablet, Take 1 tablet (50 mg total) by mouth daily as needed for erectile dysfunction., Disp: 10 tablet, Rfl: 0  EXAM:  VITALS per patient if applicable: None.  GENERAL: alert, oriented, appears well and in no acute distress  HEENT: atraumatic, conjunttiva clear, no obvious abnormalities on inspection of external nose and ears  NECK: normal movements of the head and neck  LUNGS: on inspection no signs of respiratory distress, breathing rate appears normal, no obvious gross SOB, gasping or wheezing  CV: no obvious cyanosis  MS: moves all visible extremities without noticeable abnormality  PSYCH/NEURO: pleasant and cooperative, no obvious depression or anxiety, speech and thought processing grossly intact  ASSESSMENT AND PLAN:  Discussed the following assessment and plan:  Essential hypertension Well-controlled.  Continue current regimen.  Follow-up in 3 months.  Diabetes mellitus type 2, uncomplicated (HCC) We will add Jardiance back.  I discussed that typically when starting Ozempic I discontinue Januvia as the mechanism of action is redundant.  He will start checking his sugars once daily and vary them with fasting and 2 hours postprandial.  ED (erectile dysfunction) of organic origin Will trial Viagra.  Advised that he needs to let EMS or the ED know that he has taken this within the last 24 to 48 hours if he has to go be evaluated.  Discussed that he should discontinue sexual activity if he develops chest pain or shortness of breath.  Hyperlipidemia Continue Crestor.  Tiredness This may be related to inadequate sleep.  Discussed getting 7 to 8 hours of sleep nightly.  We will see how he does with treatment of his diabetes  as well.  If not improving would consider sleep study.    I discussed the assessment and treatment plan with the patient. The patient was provided an opportunity to ask questions and all were answered. The patient agreed with the plan and demonstrated an understanding of the instructions.   The patient was advised to call back or seek an in-person evaluation if the symptoms worsen or if the condition fails to improve as anticipated.   Tommi Rumps, MD

## 2019-03-05 NOTE — Assessment & Plan Note (Signed)
Well controlled. Continue current regimen. Follow up in  3 months.  

## 2019-03-05 NOTE — Assessment & Plan Note (Signed)
Continue Crestor 

## 2019-03-05 NOTE — Assessment & Plan Note (Signed)
This may be related to inadequate sleep.  Discussed getting 7 to 8 hours of sleep nightly.  We will see how he does with treatment of his diabetes as well.  If not improving would consider sleep study.

## 2019-03-07 ENCOUNTER — Encounter: Payer: Self-pay | Admitting: Family Medicine

## 2019-04-01 ENCOUNTER — Other Ambulatory Visit: Payer: Self-pay | Admitting: Family Medicine

## 2019-04-07 ENCOUNTER — Other Ambulatory Visit: Payer: Self-pay | Admitting: Family Medicine

## 2019-05-01 ENCOUNTER — Other Ambulatory Visit: Payer: Self-pay | Admitting: Family Medicine

## 2019-05-28 ENCOUNTER — Other Ambulatory Visit: Payer: Self-pay | Admitting: Family Medicine

## 2019-06-03 ENCOUNTER — Other Ambulatory Visit: Payer: Self-pay | Admitting: Family Medicine

## 2019-06-09 ENCOUNTER — Encounter: Payer: Self-pay | Admitting: Family Medicine

## 2019-06-09 ENCOUNTER — Other Ambulatory Visit: Payer: Self-pay

## 2019-06-09 ENCOUNTER — Ambulatory Visit (INDEPENDENT_AMBULATORY_CARE_PROVIDER_SITE_OTHER): Payer: BC Managed Care – PPO | Admitting: Family Medicine

## 2019-06-09 VITALS — Ht 73.0 in | Wt 206.0 lb

## 2019-06-09 DIAGNOSIS — I10 Essential (primary) hypertension: Secondary | ICD-10-CM

## 2019-06-09 DIAGNOSIS — E785 Hyperlipidemia, unspecified: Secondary | ICD-10-CM | POA: Diagnosis not present

## 2019-06-09 DIAGNOSIS — E119 Type 2 diabetes mellitus without complications: Secondary | ICD-10-CM | POA: Diagnosis not present

## 2019-06-09 DIAGNOSIS — Z1211 Encounter for screening for malignant neoplasm of colon: Secondary | ICD-10-CM | POA: Diagnosis not present

## 2019-06-09 MED ORDER — HYDROCHLOROTHIAZIDE 25 MG PO TABS: 25.0000 mg | ORAL_TABLET | Freq: Every day | ORAL | 1 refills | Status: DC

## 2019-06-09 MED ORDER — JARDIANCE 10 MG PO TABS: 10.0000 mg | ORAL_TABLET | Freq: Every day | ORAL | 1 refills | Status: DC

## 2019-06-09 MED ORDER — METFORMIN HCL 1000 MG PO TABS: ORAL_TABLET | ORAL | 1 refills | Status: DC

## 2019-06-09 MED ORDER — OZEMPIC (0.25 OR 0.5 MG/DOSE) 2 MG/1.5ML ~~LOC~~ SOPN: 1.0000 mg | PEN_INJECTOR | SUBCUTANEOUS | 3 refills | Status: DC

## 2019-06-09 MED ORDER — AMLODIPINE BESY-BENAZEPRIL HCL 10-40 MG PO CAPS: ORAL_CAPSULE | ORAL | 1 refills | Status: DC

## 2019-06-09 MED ORDER — ROSUVASTATIN CALCIUM 20 MG PO TABS: 20.0000 mg | ORAL_TABLET | Freq: Every day | ORAL | 1 refills | Status: DC

## 2019-06-09 NOTE — Assessment & Plan Note (Signed)
Well-controlled.  Continue current regimen. 

## 2019-06-09 NOTE — Assessment & Plan Note (Signed)
Above goal.  We will increase his HCTZ.  He will take 2 of his current HCT tablets and then pick up a new prescription for HCTZ 25 mg once daily.  He will continue amlodipine and benazepril.  He will come in sometime this week for lab work and then again in a month to recheck.

## 2019-06-09 NOTE — Progress Notes (Signed)
Virtual Visit via video Note  This visit type was conducted due to national recommendations for restrictions regarding the COVID-19 pandemic (e.g. social distancing).  This format is felt to be most appropriate for this patient at this time.  All issues noted in this document were discussed and addressed.  No physical exam was performed (except for noted visual exam findings with Video Visits).   I connected with Murrell Converse today at  4:00 PM EST by a video enabled telemedicine application or telephone and verified that I am speaking with the correct person using two identifiers. Location patient: work Location provider: work Persons participating in the virtual visit: patient, provider  I discussed the limitations, risks, security and privacy concerns of performing an evaluation and management service by telephone and the availability of in person appointments. I also discussed with the patient that there may be a patient responsible charge related to this service. The patient expressed understanding and agreed to proceed.  Reason for visit: follow-up  HPI: HYPERTENSION Disease Monitoring: Blood pressure range-132/70s-low 80s Chest pain- no      Dyspnea- no Medications: Compliance- amlodipine, benazepril, HCTZ Lightheadedness- no   Edema- no  DIABETES Disease Monitoring: Blood Sugar ranges-116 fasting, 120s 2 hours post prandial Polyuria/phagia/dipsia- no      Optho- UTD Medications: Compliance- taking jardiance, metformin, ozempic Hypoglycemic symptoms- occasionally a little weak, drinks OJ and is fine after this  HYPERLIPIDEMIA Disease Monitoring: See symptoms for Hypertension Medications: Compliance- taking crestor Right upper quadrant pain- no  Muscle aches- no    ROS: See pertinent positives and negatives per HPI.  Past Medical History:  Diagnosis Date  . DM type 2 (diabetes mellitus, type 2) ([place])   . Essential hypertension   . Hyperlipidemia   . Seasonal  allergies     Past Surgical History:  Procedure Laterality Date  . APPENDECTOMY    . VASECTOMY      Family History  Problem Relation Age of Onset  . Diabetes Mother   . Heart disease Mother   . Stroke Father   . Prostate cancer Father   . Hypertension Father   . COPD Sister   . Heart disease Brother        single heart bypass  . Hypertension Maternal Grandmother     SOCIAL HX: Smoker   Current Outpatient Medications:  .  amLODipine-benazepril (LOTREL) 10-40 MG capsule, TAKE 1 CAPSULE BY MOUTH EVERY DAY, Disp: 90 capsule, Rfl: 1 .  aspirin 81 MG tablet, Take 1 tablet by mouth daily., Disp: , Rfl:  .  b complex vitamins tablet, Take 1 tablet by mouth daily., Disp: , Rfl:  .  blood glucose meter kit and supplies KIT, Dispense based on patient and insurance preference. Use up to four times daily as directed. (FOR ICD-9 250.00, 250.01)., Disp: 1 each, Rfl: 0 .  Cinnamon 500 MG TABS, Take 8 tablets by mouth daily., Disp: , Rfl:  .  empagliflozin (JARDIANCE) 10 MG TABS tablet, Take 10 mg by mouth daily before breakfast., Disp: 90 tablet, Rfl: 1 .  hydrochlorothiazide (HYDRODIURIL) 25 MG tablet, Take 1 tablet (25 mg total) by mouth daily., Disp: 90 tablet, Rfl: 1 .  metFORMIN (GLUCOPHAGE) 1000 MG tablet, TAKE 1 TABLET BY MOUTH EVERY MORNING AND 1 1/2 TABLETS BY MOUTH EVERY EVENING, Disp: 75 tablet, Rfl: 1 .  MULTIPLE VITAMIN PO, Take 1 tablet by mouth daily., Disp: , Rfl:  .  rosuvastatin (CRESTOR) 20 MG tablet, Take 1 tablet (20 mg total)  by mouth daily., Disp: 90 tablet, Rfl: 1 .  Semaglutide,0.25 or 0.5MG/DOS, (OZEMPIC, 0.25 OR 0.5 MG/DOSE,) 2 MG/1.5ML SOPN, Inject 1 mg into the skin once a week., Disp: 9 mL, Rfl: 3 .  tadalafil (CIALIS) 10 MG tablet, Please specify directions, refills and quantity, Disp: 10 tablet, Rfl: 0  EXAM:  VITALS per patient if applicable:  GENERAL: alert, oriented, appears well and in no acute distress  HEENT: atraumatic, conjunttiva clear, no obvious  abnormalities on inspection of external nose and ears  NECK: normal movements of the head and neck  LUNGS: on inspection no signs of respiratory distress, breathing rate appears normal, no obvious gross SOB, gasping or wheezing  CV: no obvious cyanosis  MS: moves all visible extremities without noticeable abnormality  PSYCH/NEURO: pleasant and cooperative, no obvious depression or anxiety, speech and thought processing grossly intact  ASSESSMENT AND PLAN:  Discussed the following assessment and plan:  Essential hypertension Above goal.  We will increase his HCTZ.  He will take 2 of his current HCT tablets and then pick up a new prescription for HCTZ 25 mg once daily.  He will continue amlodipine and benazepril.  He will come in sometime this week for lab work and then again in a month to recheck.  Diabetes mellitus type 2, uncomplicated ([place]) Seems to be well controlled based on CBGs.  We will check an A1c.  Hyperlipidemia Well-controlled.  Continue current regimen.  Colon cancer screening Cologuard ordered.  Patient denies history of prior colonoscopy.  He notes no family history of colon cancer.  No rectal bleeding.  Discussed that he should check with his insurance to make sure this is covered.  Advised that if he had a positive Cologuard he would have to have a colonoscopy.  Discussed that if he had to have a colonoscopy for positive Cologuard his insurance may not cover the colonoscopy as it would be diagnostic.   Orders Placed This Encounter  Procedures  . Basic Metabolic Panel (BMET)  . HgB A1c  . Cologuard    Meds ordered this encounter  Medications  . hydrochlorothiazide (HYDRODIURIL) 25 MG tablet    Sig: Take 1 tablet (25 mg total) by mouth daily.    Dispense:  90 tablet    Refill:  1  . amLODipine-benazepril (LOTREL) 10-40 MG capsule    Sig: TAKE 1 CAPSULE BY MOUTH EVERY DAY    Dispense:  90 capsule    Refill:  1  . empagliflozin (JARDIANCE) 10 MG TABS tablet      Sig: Take 10 mg by mouth daily before breakfast.    Dispense:  90 tablet    Refill:  1  . metFORMIN (GLUCOPHAGE) 1000 MG tablet    Sig: TAKE 1 TABLET BY MOUTH EVERY MORNING AND 1 1/2 TABLETS BY MOUTH EVERY EVENING    Dispense:  75 tablet    Refill:  1  . Semaglutide,0.25 or 0.5MG/DOS, (OZEMPIC, 0.25 OR 0.5 MG/DOSE,) 2 MG/1.5ML SOPN    Sig: Inject 1 mg into the skin once a week.    Dispense:  9 mL    Refill:  3  . rosuvastatin (CRESTOR) 20 MG tablet    Sig: Take 1 tablet (20 mg total) by mouth daily.    Dispense:  90 tablet    Refill:  1     I discussed the assessment and treatment plan with the patient. The patient was provided an opportunity to ask questions and all were answered. The patient agreed with the  plan and demonstrated an understanding of the instructions.   The patient was advised to call back or seek an in-person evaluation if the symptoms worsen or if the condition fails to improve as anticipated.    Tommi Rumps, MD

## 2019-06-09 NOTE — Assessment & Plan Note (Signed)
Seems to be well controlled based on CBGs.  We will check an A1c.

## 2019-06-09 NOTE — Assessment & Plan Note (Signed)
Cologuard ordered.  Patient denies history of prior colonoscopy.  He notes no family history of colon cancer.  No rectal bleeding.  Discussed that he should check with his insurance to make sure this is covered.  Advised that if he had a positive Cologuard he would have to have a colonoscopy.  Discussed that if he had to have a colonoscopy for positive Cologuard his insurance may not cover the colonoscopy as it would be diagnostic.

## 2019-06-09 NOTE — Addendum Note (Signed)
Addended by: Warden Fillers on: 06/09/2019 04:48 PM   Modules accepted: Orders

## 2019-06-13 ENCOUNTER — Telehealth: Payer: Self-pay | Admitting: Family Medicine

## 2019-06-13 MED ORDER — OZEMPIC (1 MG/DOSE) 2 MG/1.5ML ~~LOC~~ SOPN: 1.0000 mg | PEN_INJECTOR | SUBCUTANEOUS | 1 refills | Status: DC

## 2019-06-13 NOTE — Telephone Encounter (Signed)
New prescription sent to pharmacy 

## 2019-06-13 NOTE — Addendum Note (Signed)
Addended by: Birdie Sons, Mohammedali Bedoy G on: 06/13/2019 01:02 PM   Modules accepted: Orders

## 2019-06-13 NOTE — Telephone Encounter (Signed)
[facility] on Semaglutide,0.25 or 0.5MG /DOS, (OZEMPIC, 0.25 OR 0.5 MG/DOSE,) 2 MG/1.5ML SOPN

## 2019-06-13 NOTE — Telephone Encounter (Signed)
[facility] on Semaglutide,0.25 or 0.5MG /DOS, (OZEMPIC, 0.25 OR 0.5 MG/DOSE,) 2 MG/1.5ML SOPN.  Aspynn Clover,cma

## 2019-06-16 ENCOUNTER — Encounter: Payer: Self-pay | Admitting: Family Medicine

## 2019-06-18 ENCOUNTER — Other Ambulatory Visit: Payer: Self-pay

## 2019-06-18 ENCOUNTER — Other Ambulatory Visit (INDEPENDENT_AMBULATORY_CARE_PROVIDER_SITE_OTHER): Payer: BC Managed Care – PPO

## 2019-06-18 DIAGNOSIS — I10 Essential (primary) hypertension: Secondary | ICD-10-CM | POA: Diagnosis not present

## 2019-06-18 DIAGNOSIS — E119 Type 2 diabetes mellitus without complications: Secondary | ICD-10-CM | POA: Diagnosis not present

## 2019-06-19 LAB — HEMOGLOBIN A1C: Hgb A1c MFr Bld: 8 % — ABNORMAL HIGH (ref 4.6–6.5)

## 2019-06-19 LAB — BASIC METABOLIC PANEL
BUN: 12 mg/dL (ref 6–23)
CO2: 26 mEq/L (ref 19–32)
Calcium: 9.9 mg/dL (ref 8.4–10.5)
Chloride: 97 mEq/L (ref 96–112)
Creatinine, Ser: 0.76 mg/dL (ref 0.40–1.50)
GFR: 105.58 mL/min (ref >60.00)
Glucose, Bld: 108 mg/dL — ABNORMAL HIGH (ref 70–99)
Potassium: 3.8 mEq/L (ref 3.5–5.1)
Sodium: 135 mEq/L (ref 135–145)

## 2019-06-26 ENCOUNTER — Telehealth: Payer: Self-pay

## 2019-07-16 ENCOUNTER — Ambulatory Visit: Payer: BC Managed Care – PPO

## 2019-07-16 ENCOUNTER — Other Ambulatory Visit: Payer: Self-pay

## 2019-07-16 DIAGNOSIS — I10 Essential (primary) hypertension: Secondary | ICD-10-CM

## 2019-07-16 NOTE — Progress Notes (Signed)
Patient is here for a BP check due to bp being high at last visit, as per patient.  Currently patients BP is 158/92 and BPM is 90. After ten minute rest period patients bp was 152/80, BPM still 90.  Patient has no complaints of headaches, blurry vision, chest pain, arm pain, light headedness, dizziness, and nor jaw pain.

## 2019-07-18 NOTE — Progress Notes (Signed)
Patient has been made aware. He is ok with medication. [facility] on [address]

## 2019-07-20 ENCOUNTER — Encounter: Payer: Self-pay | Admitting: Family Medicine

## 2019-07-20 DIAGNOSIS — E119 Type 2 diabetes mellitus without complications: Secondary | ICD-10-CM

## 2019-07-20 MED ORDER — CARVEDILOL 6.25 MG PO TABS: 6.2500 mg | ORAL_TABLET | Freq: Two times a day (BID) | ORAL | 3 refills | Status: DC

## 2019-07-20 NOTE — Progress Notes (Signed)
Sent to pharmacy 

## 2019-07-20 NOTE — Addendum Note (Signed)
Addended by: Glori Luis on: 07/20/2019 11:33 AM   Modules accepted: Orders

## 2019-07-21 MED ORDER — OZEMPIC (1 MG/DOSE) 2 MG/1.5ML ~~LOC~~ SOPN: 1.0000 mg | PEN_INJECTOR | SUBCUTANEOUS | 1 refills | Status: DC

## 2019-07-22 MED ORDER — OZEMPIC (1 MG/DOSE) 2 MG/1.5ML ~~LOC~~ SOPN: 1.0000 mg | PEN_INJECTOR | SUBCUTANEOUS | 1 refills | Status: DC

## 2019-07-22 NOTE — Addendum Note (Signed)
Addended by: Birdie Sons, Jabin Tapp G on: 07/22/2019 02:03 PM   Modules accepted: Orders

## 2019-07-29 ENCOUNTER — Encounter: Payer: Self-pay | Admitting: Family Medicine

## 2019-09-10 ENCOUNTER — Encounter: Payer: Self-pay | Admitting: Family Medicine

## 2019-09-10 ENCOUNTER — Other Ambulatory Visit: Payer: Self-pay

## 2019-09-10 ENCOUNTER — Ambulatory Visit: Payer: BC Managed Care – PPO | Admitting: Family Medicine

## 2019-09-10 DIAGNOSIS — E119 Type 2 diabetes mellitus without complications: Secondary | ICD-10-CM

## 2019-09-10 DIAGNOSIS — F17210 Nicotine dependence, cigarettes, uncomplicated: Secondary | ICD-10-CM | POA: Insufficient documentation

## 2019-09-10 DIAGNOSIS — Z1211 Encounter for screening for malignant neoplasm of colon: Secondary | ICD-10-CM

## 2019-09-10 DIAGNOSIS — I10 Essential (primary) hypertension: Secondary | ICD-10-CM

## 2019-09-10 MED ORDER — CARVEDILOL 6.25 MG PO TABS: 6.2500 mg | ORAL_TABLET | Freq: Two times a day (BID) | ORAL | 3 refills | Status: DC

## 2019-09-10 MED ORDER — CARVEDILOL 12.5 MG PO TABS: 12.5000 mg | ORAL_TABLET | Freq: Two times a day (BID) | ORAL | 3 refills | Status: DC

## 2019-09-10 NOTE — Assessment & Plan Note (Addendum)
Seems to be well controlled.  He will return for an A1c.  Continue current medication.  Nausea is likely related to Ozempic.  He will monitor this as it is improving.

## 2019-09-10 NOTE — Progress Notes (Signed)
Marikay Alar, MD Phone: [phone]  Chad Frye is a 59 y.o. male who presents today for f/u.  HYPERTENSION  Disease Monitoring  Home BP Monitoring mostly in the 120s/80s Chest pain- no    Dyspnea- no Medications  Compliance-  Taking amlodipine, benazepril, coreg, HCTZ.   Edema- no  DIABETES Disease Monitoring: Blood Sugar ranges-109-120 fasting, 98-115 later in the day Polyuria/phagia/dipsia- no      Optho- UTD Medications: Compliance- taking metformin, jardiance, ozempic Hypoglycemic symptoms- one time had light headedness and drank juice with improvement.  Patient does note he has some nausea 1-2 times a week.  This started after increasing his Ozempic dose.  It has been improving.  Tobacco abuse: Continues to smoke.  Not ready to quit.   Social History   Tobacco Use  Smoking Status Current Every Day Smoker  . Last attempt to quit: 11/28/2015  . Years since quitting: 3.7  Smokeless Tobacco Never Used     ROS see history of present illness  Objective  Physical Exam Vitals:   09/10/19 1614  BP: 130/70  Pulse: 96  Temp: (!) 96.1 F (35.6 C)  SpO2: 96%    BP Readings from Last 3 Encounters:  09/10/19 130/70  02/15/18 126/76  01/14/18 (!) 146/86   Wt Readings from Last 3 Encounters:  09/10/19 208 lb (94.3 kg)  06/09/19 206 lb (93.4 kg)  03/05/19 208 lb (94.3 kg)    Physical Exam Constitutional:      General: He is not in acute distress.    Appearance: He is not diaphoretic.  Cardiovascular:     Rate and Rhythm: Normal rate and regular rhythm.     Heart sounds: Normal heart sounds.  Pulmonary:     Effort: Pulmonary effort is normal.     Breath sounds: Normal breath sounds.  Musculoskeletal:     Right lower leg: No edema.     Left lower leg: No edema.  Skin:    General: Skin is warm and dry.  Neurological:     Mental Status: He is alert.    Diabetic Foot Exam - Simple   Simple Foot Form Diabetic Foot exam was performed with the  following findings: Yes 09/10/2019  4:23 PM  Visual Inspection No deformities, no ulcerations, no other skin breakdown bilaterally: Yes Sensation Testing Intact to touch and monofilament testing bilaterally: Yes Pulse Check Posterior Tibialis and Dorsalis pulse intact bilaterally: Yes Comments     Assessment/Plan: Please see individual problem list.  Essential hypertension Diastolic slightly elevated.  We will increase his carvedilol.  He will continue his other medications.  BP check in 1 month.  Diabetes mellitus type 2, uncomplicated (HCC) Seems to be well controlled.  He will return for an A1c.  Continue current medication.  Nausea is likely related to Ozempic.  He will monitor this as it is improving.  Colon cancer screening I encouraged him to complete the Cologuard.  Nicotine dependence, cigarettes, uncomplicated Smoking cessation counseling was provided.  Approximately 3 minutes were spent discussing the rationale for tobacco cessation and strategies for doing so.  Adjuncts, including nicotine patches, nicotine lozenges, varenicline and buproprion were recommended. F/u in one month on this.     Health Maintenance: Encouraged him to consider COVID-19 vaccine.  No orders of the defined types were placed in this encounter.   Meds ordered this encounter  Medications  . carvedilol (COREG) 6.25 MG tablet    Sig: Take 1 tablet (6.25 mg total) by mouth 2 (two) times  daily with a meal.    Dispense:  60 tablet    Refill:  3    This visit occurred during the SARS-CoV-2 public health emergency.  Safety protocols were in place, including screening questions prior to the visit, additional usage of staff PPE, and extensive cleaning of exam room while observing appropriate contact time as indicated for disinfecting solutions.    Tommi Rumps, MD [place]

## 2019-09-10 NOTE — Assessment & Plan Note (Signed)
Diastolic slightly elevated.  We will increase his carvedilol.  He will continue his other medications.  BP check in 1 month.

## 2019-09-10 NOTE — Assessment & Plan Note (Signed)
Smoking cessation counseling was provided.  Approximately 3 minutes were spent discussing the rationale for tobacco cessation and strategies for doing so.  Adjuncts, including nicotine patches, nicotine lozenges, varenicline and buproprion were recommended. F/u in one month on this.

## 2019-09-10 NOTE — Assessment & Plan Note (Signed)
I encouraged him to complete the Cologuard.

## 2019-09-10 NOTE — Patient Instructions (Signed)
Nice to see you. We will have you return to check your A1c. We will have you come back for BP check in 1 month.  We are increasing your carvedilol.  Please continue to monitor your blood pressure. Please consider Cologuard. Please consider smoking cessation.

## 2019-09-24 ENCOUNTER — Other Ambulatory Visit: Payer: Self-pay

## 2019-09-24 ENCOUNTER — Ambulatory Visit (INDEPENDENT_AMBULATORY_CARE_PROVIDER_SITE_OTHER): Payer: BC Managed Care – PPO

## 2019-09-24 DIAGNOSIS — E119 Type 2 diabetes mellitus without complications: Secondary | ICD-10-CM

## 2019-09-24 DIAGNOSIS — Z23 Encounter for immunization: Secondary | ICD-10-CM

## 2019-09-24 LAB — POCT GLYCOSYLATED HEMOGLOBIN (HGB A1C): Hemoglobin A1C: 7.9 % — AB (ref 4.0–5.6)

## 2019-09-24 NOTE — Progress Notes (Signed)
Patient presented for POCT A1C, patient voiced no concerns nor showed any signs of distress. After A1c was collected patient stated he wanted an TDAP vaccine. Dr. Birdie Sons gave verbal order for patient to receive in office today.The Tdap injection was given in the left deltoid, patient voiced no concerns nor showed any signs of distress during injection.

## 2019-09-25 ENCOUNTER — Telehealth: Payer: Self-pay

## 2019-09-25 NOTE — Telephone Encounter (Signed)
-----   Message from Glori Luis, MD sent at 09/25/2019  2:57 PM EDT ----- Please let the patient know his A1c is not appreciably improved. I would like for him to see our clinical pharmacist to discuss diabetes management. I can place a referral if he is ok with this.

## 2019-09-29 ENCOUNTER — Telehealth: Payer: Self-pay

## 2019-10-02 NOTE — Telephone Encounter (Signed)
Close  

## 2019-10-15 ENCOUNTER — Other Ambulatory Visit: Payer: Self-pay

## 2019-10-15 ENCOUNTER — Other Ambulatory Visit: Payer: Self-pay | Admitting: Family Medicine

## 2019-10-15 ENCOUNTER — Ambulatory Visit: Payer: BC Managed Care – PPO | Admitting: Family Medicine

## 2019-10-15 ENCOUNTER — Encounter: Payer: Self-pay | Admitting: Family Medicine

## 2019-10-15 DIAGNOSIS — I10 Essential (primary) hypertension: Secondary | ICD-10-CM | POA: Diagnosis not present

## 2019-10-15 DIAGNOSIS — E119 Type 2 diabetes mellitus without complications: Secondary | ICD-10-CM | POA: Diagnosis not present

## 2019-10-15 MED ORDER — EMPAGLIFLOZIN 25 MG PO TABS: 25.0000 mg | ORAL_TABLET | Freq: Every day | ORAL | 1 refills | Status: DC

## 2019-10-15 NOTE — Progress Notes (Signed)
  Chad Rumps, MD Phone: [phone]  Chad Frye is a 59 y.o. male who presents today for f/u.  HYPERTENSION  Disease Monitoring  Home BP Monitoring 118-120s/78-82        Chest pain- no    Dyspnea- no Medications  Compliance-  Taking amlodipine, benazepril, HCTZ, coreg.   Edema- no  DIABETES Disease Monitoring: Blood Sugar ranges-fasting low 100s  Polyuria/phagia/dipsia- no      Optho- UTD Medications: Compliance- taking ozempic, metformin, jardiance Hypoglycemic symptoms- no  COVID-19 vaccine discussion: Patient notes he is willing to get this. His wife has an immune deficiency and has met with an infectious disease specialist who noted that the wife couldn't get it yet though the patient can have the vaccine. Patient wants to know what I think about him getting the vaccine.   Social History   Tobacco Use  Smoking Status Current Every Day Smoker  . Last attempt to quit: 11/28/2015  . Years since quitting: 3.8  Smokeless Tobacco Never Used     ROS see history of present illness  Objective  Physical Exam Vitals:   10/15/19 1516  BP: 120/80  Pulse: 74  Temp: (!) 96.8 F (36 C)  SpO2: 97%    BP Readings from Last 3 Encounters:  10/15/19 120/80  09/10/19 130/70  02/15/18 126/76   Wt Readings from Last 3 Encounters:  10/15/19 203 lb 6.4 oz (92.3 kg)  09/10/19 208 lb (94.3 kg)  06/09/19 206 lb (93.4 kg)    Physical Exam Constitutional:      General: He is not in acute distress.    Appearance: He is not diaphoretic.  Cardiovascular:     Rate and Rhythm: Normal rate and regular rhythm.     Heart sounds: Normal heart sounds.  Pulmonary:     Effort: Pulmonary effort is normal.     Breath sounds: Normal breath sounds.  Skin:    General: Skin is warm and dry.  Neurological:     Mental Status: He is alert.      Assessment/Plan: Please see individual problem list.  Essential hypertension Improved. Continue current medication. Follow-up in 3  months.  Diabetes mellitus type 2, uncomplicated ([place]) Not at goal based on last A1c. Discussed checking 2-hour postprandial CBGs to see if he is running elevated then as his fasting seem adequately controlled. We will increase his Jardiance. He'll continue Ozempic and Metformin.   Health Maintenance: I advised that it would be okay for this patient to get the Covid vaccine. Discussed that there is no risk of passing anything to his wife if he got the [place] and moderna vaccine as there is no virus component in these vaccines.  No orders of the defined types were placed in this encounter.   Meds ordered this encounter  Medications  . empagliflozin (JARDIANCE) 25 MG TABS tablet    Sig: Take 1 tablet (25 mg total) by mouth daily before breakfast.    Dispense:  90 tablet    Refill:  1    This visit occurred during the SARS-CoV-2 public health emergency.  Safety protocols were in place, including screening questions prior to the visit, additional usage of staff PPE, and extensive cleaning of exam room while observing appropriate contact time as indicated for disinfecting solutions.    Chad Rumps, MD [place]

## 2019-10-15 NOTE — Patient Instructions (Signed)
Nice to see you. We have increased her Jardiance. We'll see you back in 3 months. You can call [phone] to schedule the COVID-19 vaccine through the health department.

## 2019-10-15 NOTE — Assessment & Plan Note (Signed)
Improved. Continue current medication. Follow up in 3 months.

## 2019-10-15 NOTE — Assessment & Plan Note (Signed)
Not at goal based on last A1c. Discussed checking 2-hour postprandial CBGs to see if he is running elevated then as his fasting seem adequately controlled. We will increase his Jardiance. He'll continue Ozempic and Metformin.

## 2019-11-19 ENCOUNTER — Ambulatory Visit: Payer: BC Managed Care – PPO | Admitting: Family Medicine

## 2019-11-19 ENCOUNTER — Other Ambulatory Visit: Payer: Self-pay

## 2019-11-19 DIAGNOSIS — R110 Nausea: Secondary | ICD-10-CM

## 2019-11-19 DIAGNOSIS — I10 Essential (primary) hypertension: Secondary | ICD-10-CM

## 2019-11-19 DIAGNOSIS — E119 Type 2 diabetes mellitus without complications: Secondary | ICD-10-CM

## 2019-11-19 LAB — CBC WITH DIFFERENTIAL/PLATELET
Basophils Absolute: 0.1 10*3/uL (ref 0.0–0.1)
Basophils Relative: 0.7 % (ref 0.0–3.0)
Eosinophils Absolute: 0.1 10*3/uL (ref 0.0–0.7)
Eosinophils Relative: 1.6 % (ref 0.0–5.0)
HCT: 44.8 % (ref 39.0–52.0)
Hemoglobin: 15.1 g/dL (ref 13.0–17.0)
Lymphocytes Relative: 30.4 % (ref 12.0–46.0)
Lymphs Abs: 2.4 10*3/uL (ref 0.7–4.0)
MCHC: 33.7 g/dL (ref 30.0–36.0)
MCV: 89 fl (ref 78.0–100.0)
Monocytes Absolute: 0.9 10*3/uL (ref 0.1–1.0)
Monocytes Relative: 11.5 % (ref 3.0–12.0)
Neutro Abs: 4.4 10*3/uL (ref 1.4–7.7)
Neutrophils Relative %: 55.8 % (ref 43.0–77.0)
Platelets: 320 10*3/uL (ref 150.0–400.0)
RBC: 5.03 Mil/uL (ref 4.22–5.81)
RDW: 13.2 % (ref 11.5–15.5)
WBC: 7.9 10*3/uL (ref 4.0–10.5)

## 2019-11-19 LAB — COMPREHENSIVE METABOLIC PANEL
ALT: 29 U/L (ref 0–53)
AST: 18 U/L (ref 0–37)
Albumin: 4.8 g/dL (ref 3.5–5.2)
Alkaline Phosphatase: 85 U/L (ref 39–117)
BUN: 19 mg/dL (ref 6–23)
CO2: 25 mEq/L (ref 19–32)
Calcium: 9.9 mg/dL (ref 8.4–10.5)
Chloride: 99 mEq/L (ref 96–112)
Creatinine, Ser: 0.81 mg/dL (ref 0.40–1.50)
GFR: 97.95 mL/min (ref >60.00)
Glucose, Bld: 99 mg/dL (ref 70–99)
Potassium: 4.1 mEq/L (ref 3.5–5.1)
Sodium: 134 mEq/L — ABNORMAL LOW (ref 135–145)
Total Bilirubin: 0.4 mg/dL (ref 0.2–1.2)
Total Protein: 7.2 g/dL (ref 6.0–8.3)

## 2019-11-19 LAB — TSH: TSH: 1.77 u[IU]/mL (ref 0.35–4.50)

## 2019-11-19 NOTE — Patient Instructions (Signed)
Nice to see you. We will check labs today. We will check with our clinical pharmacist regarding the Ozempic and then will contact you with instructions.

## 2019-11-19 NOTE — Assessment & Plan Note (Signed)
Adequate control.  Continue current medication. 

## 2019-11-19 NOTE — Progress Notes (Signed)
Tommi Rumps, MD Phone: [phone]  Chad Frye is a 59 y.o. male who presents today for f/u.  HYPERTENSION  Disease Monitoring  Home BP Monitoring lower than here  Chest pain- no    Dyspnea- no Medications  Compliance-  Taking amlodipine, benazepril, coreg.   Edema- no  DIABETES Disease Monitoring: Blood Sugar ranges-much improved, in the 90s and low 100s Polyuria/phagia/dipsia- no      Optho- UTD Medications: Compliance- taking ozempic, metformin, jardiance Hypoglycemic symptoms- no  Nausea: This is been an ongoing issue for about 3 months.  Started around the time that he increase the dose of the Ozempic.  Does not occur every day.  Typically occurs 1-2 times a week though he can skip weeks.  He does have some vomiting with it that is nonbloody and nonbilious.  No diarrhea.  No abdominal pain.  Occasional reflux with this.  No blood in his stool.  Weight has trended down slightly.     Social History   Tobacco Use  Smoking Status Current Every Day Smoker  . Last attempt to quit: 11/28/2015  . Years since quitting: 3.9  Smokeless Tobacco Never Used     ROS see history of present illness  Objective  Physical Exam Vitals:   11/19/19 1315  BP: 130/70  Pulse: 78  Temp: 98.3 F (36.8 C)  SpO2: 96%    BP Readings from Last 3 Encounters:  11/19/19 130/70  10/15/19 120/80  09/10/19 130/70   Wt Readings from Last 3 Encounters:  11/19/19 195 lb 6.4 oz (88.6 kg)  10/15/19 203 lb 6.4 oz (92.3 kg)  09/10/19 208 lb (94.3 kg)    Physical Exam Constitutional:      General: He is not in acute distress.    Appearance: He is not diaphoretic.  Cardiovascular:     Rate and Rhythm: Normal rate and regular rhythm.     Heart sounds: Normal heart sounds.  Pulmonary:     Effort: Pulmonary effort is normal.     Breath sounds: Normal breath sounds.  Abdominal:     General: Bowel sounds are normal. There is no distension.     Palpations: Abdomen is soft.      Tenderness: There is no abdominal tenderness. There is no guarding or rebound.  Musculoskeletal:     Right lower leg: No edema.     Left lower leg: No edema.  Skin:    General: Skin is warm and dry.  Neurological:     Mental Status: He is alert.      Assessment/Plan: Please see individual problem list.  Essential hypertension Adequate control.  Continue current medication.  Diabetes mellitus type 2, uncomplicated ([place]) Well-controlled though patient is having some nausea likely related to the [place].  We will check with our clinical pharmacist regarding tapering down on the Ozempic to a slightly lower dose.  We will contact the patient once we hear back.  He will continue Jardiance and Metformin.  Nausea Patient with occasional nausea and vomiting.  Suspect related to [place].  Suspect weight loss is related to this as well.  Will check with our clinical pharmacist regarding decreasing the dose to see if this improves.  We will check basic labs to rule out some other underlying causes for nausea and weight loss.   Health Maintenance: He is still contemplating completing the Cologuard.  It is within date. He will complete when he is ready to.   Orders Placed This Encounter  Procedures  . TSH  .  Comp Met (CMET)  . CBC w/Diff    No orders of the defined types were placed in this encounter.   This visit occurred during the SARS-CoV-2 public health emergency.  Safety protocols were in place, including screening questions prior to the visit, additional usage of staff PPE, and extensive cleaning of exam room while observing appropriate contact time as indicated for disinfecting solutions.    Tommi Rumps, MD [place]

## 2019-11-19 NOTE — Assessment & Plan Note (Signed)
Patient with occasional nausea and vomiting.  Suspect related to Ozempic.  Suspect weight loss is related to this as well.  Will check with our clinical pharmacist regarding decreasing the dose to see if this improves.  We will check basic labs to rule out some other underlying causes for nausea and weight loss.

## 2019-11-19 NOTE — Assessment & Plan Note (Signed)
Well-controlled though patient is having some nausea likely related to the Ozempic.  We will check with our clinical pharmacist regarding tapering down on the Ozempic to a slightly lower dose.  We will contact the patient once we hear back.  He will continue Jardiance and Metformin.

## 2019-11-21 ENCOUNTER — Telehealth: Payer: Self-pay | Admitting: Family Medicine

## 2019-11-21 NOTE — Telephone Encounter (Signed)
Phone constantly rang no VM.  Santi Troung,cma

## 2019-11-21 NOTE — Telephone Encounter (Signed)
Please let the patient know that I heard back from our clinical pharmacist.  She advised to reduce his portion sizes to see if that would help.  She also noted that he should click the Ozempic pen up to 1 mg and then click back down by about 15 clicks and see if that helps with his symptoms.  He should do that for a few weeks and if his GI upset improves we could try titrating back up.  If it does not improve he should let us know.  Thanks.

## 2019-11-21 NOTE — Telephone Encounter (Signed)
-----   Message from Lourena Simmonds, [facility] sent at 11/19/2019  1:34 PM EDT ----- [name]!  1) Would counsel to reduce portion size, see if that helps.  2) There are a lot of "clicks" before he gets to the 1 mg dose on the 1 mg pen, apparently 74, to be exact. If he starts at 0 and clicks up ~18 clicks, that's 0.25 (even though not marked), up another 18, that's 0.5. Up another 34, that would be 1.0. SO. Have him click to 1 mg, then click BACK DOWN by ~15 clicks. That would be a dose roughly 0.75 mg weekly. Have him continue that dose for a few weeks, see if GI upset improves.   Does that make sense? 0.75 will not be marked on the pen, but that is about the dose he will give him self if he goes down from 1 mg by 15 clicks. If he gets stable on that, he can go back up. [name] ----- Message ----- From: Glori Luis, MD Sent: 11/19/2019   1:30 PM EDT To: Lourena Simmonds, [facility]  Hey [name],   This patient has been having some intermittent nausea and vomiting after increasing his ozempic dose to 1 mg weekly. I would like to decrease the dose to see if his symptoms improve. Do I have to go back to 0.5 mg weekly or is there a way to do an intermediate decrease? Thanks.   Minerva Areola

## 2019-11-21 NOTE — Telephone Encounter (Signed)
LVM for the patient to call back to discuss medication.  Stephano Arrants,cma

## 2019-11-25 ENCOUNTER — Encounter: Payer: Self-pay | Admitting: Family Medicine

## 2019-11-27 NOTE — Telephone Encounter (Signed)
I sent the patient a message from the provider on mychart and he responded that he understood and would try what the pharmacist recommended with his ozempic.  Linford Quintela,cma

## 2019-11-29 ENCOUNTER — Ambulatory Visit: Payer: BC Managed Care – PPO | Attending: Internal Medicine

## 2019-11-29 DIAGNOSIS — Z23 Encounter for immunization: Secondary | ICD-10-CM

## 2019-11-29 NOTE — Progress Notes (Signed)
   Covid-19 Vaccination Clinic  Name:  Chad Frye    MRN: [id number] DOB: 1961-02-03  11/29/2019  Mr. Chad Frye was observed post Covid-19 immunization for 30 minutes without incident. He was provided with Vaccine Information Sheet and instruction to access the V-Safe system.   Chad Frye was instructed to call 911 with any severe reactions post vaccine: Marland Kitchen Difficulty breathing  . Swelling of face and throat  . A fast heartbeat  . A bad rash all over body  . Dizziness and weakness   Immunizations Administered    Name Date Dose VIS Date Route   Pfizer COVID-19 Vaccine 11/29/2019 10:12 AM 0.3 mL 07/16/2018 Intramuscular   Manufacturer: [employer], [employer]   Lot: [id number]   NDC: [address]-6

## 2019-11-30 ENCOUNTER — Other Ambulatory Visit: Payer: Self-pay | Admitting: Family Medicine

## 2019-12-13 ENCOUNTER — Other Ambulatory Visit: Payer: Self-pay | Admitting: Family Medicine

## 2019-12-20 ENCOUNTER — Ambulatory Visit: Payer: BC Managed Care – PPO | Attending: Internal Medicine

## 2019-12-20 DIAGNOSIS — Z23 Encounter for immunization: Secondary | ICD-10-CM

## 2019-12-20 NOTE — Progress Notes (Signed)
   Covid-19 Vaccination Clinic  Name:  Chad Frye    MRN: [id number] DOB: 1961-03-12  12/20/2019  [name] was observed post Covid-19 immunization for 30 minutes based on pre-vaccination screening without incident. He was provided with Vaccine Information Sheet and instruction to access the V-Safe system.   [name] was instructed to call 911 with any severe reactions post vaccine: Marland Kitchen Difficulty breathing  . Swelling of face and throat  . A fast heartbeat  . A bad rash all over body  . Dizziness and weakness   Immunizations Administered    Name Date Dose VIS Date Route   Pfizer COVID-19 Vaccine 12/20/2019 10:19 AM 0.3 mL 07/16/2018 Intramuscular   Manufacturer: [employer], [employer]   Lot: [id number]   NDC: [address]-5

## 2019-12-26 ENCOUNTER — Other Ambulatory Visit: Payer: Self-pay | Admitting: Family Medicine

## 2019-12-26 DIAGNOSIS — E785 Hyperlipidemia, unspecified: Secondary | ICD-10-CM

## 2020-01-12 ENCOUNTER — Encounter: Payer: Self-pay | Admitting: Family Medicine

## 2020-01-12 DIAGNOSIS — R809 Proteinuria, unspecified: Secondary | ICD-10-CM

## 2020-01-12 DIAGNOSIS — E785 Hyperlipidemia, unspecified: Secondary | ICD-10-CM

## 2020-01-12 DIAGNOSIS — E119 Type 2 diabetes mellitus without complications: Secondary | ICD-10-CM

## 2020-01-16 ENCOUNTER — Other Ambulatory Visit (INDEPENDENT_AMBULATORY_CARE_PROVIDER_SITE_OTHER): Payer: BC Managed Care – PPO

## 2020-01-16 ENCOUNTER — Other Ambulatory Visit: Payer: Self-pay

## 2020-01-16 DIAGNOSIS — R809 Proteinuria, unspecified: Secondary | ICD-10-CM

## 2020-01-16 DIAGNOSIS — E119 Type 2 diabetes mellitus without complications: Secondary | ICD-10-CM

## 2020-01-16 DIAGNOSIS — E785 Hyperlipidemia, unspecified: Secondary | ICD-10-CM | POA: Diagnosis not present

## 2020-01-16 LAB — BASIC METABOLIC PANEL
BUN: 15 mg/dL (ref 6–23)
CO2: 28 mEq/L (ref 19–32)
Calcium: 9.7 mg/dL (ref 8.4–10.5)
Chloride: 98 mEq/L (ref 96–112)
Creatinine, Ser: 0.85 mg/dL (ref 0.40–1.50)
GFR: 92.6 mL/min (ref >60.00)
Glucose, Bld: 133 mg/dL — ABNORMAL HIGH (ref 70–99)
Potassium: 4.2 mEq/L (ref 3.5–5.1)
Sodium: 135 mEq/L (ref 135–145)

## 2020-01-16 LAB — MICROALBUMIN / CREATININE URINE RATIO
Creatinine,U: 82.4 mg/dL
Microalb Creat Ratio: 2.2 mg/g (ref 0.0–30.0)
Microalb, Ur: 1.8 mg/dL (ref 0.0–1.9)

## 2020-01-16 LAB — LIPID PANEL
Cholesterol: 138 mg/dL (ref 0–200)
HDL: 33.1 mg/dL — ABNORMAL LOW (ref >39.00)
LDL Cholesterol: 72 mg/dL (ref 0–99)
NonHDL: 104.61
Total CHOL/HDL Ratio: 4
Triglycerides: 163 mg/dL — ABNORMAL HIGH (ref 0.0–149.0)
VLDL: 32.6 mg/dL (ref 0.0–40.0)

## 2020-01-16 LAB — HEMOGLOBIN A1C: Hgb A1c MFr Bld: 7.4 % — ABNORMAL HIGH (ref 4.6–6.5)

## 2020-01-21 ENCOUNTER — Encounter: Payer: Self-pay | Admitting: Family Medicine

## 2020-01-21 ENCOUNTER — Telehealth: Payer: Self-pay | Admitting: Family Medicine

## 2020-01-21 ENCOUNTER — Telehealth (INDEPENDENT_AMBULATORY_CARE_PROVIDER_SITE_OTHER): Payer: BC Managed Care – PPO | Admitting: Family Medicine

## 2020-01-21 ENCOUNTER — Other Ambulatory Visit: Payer: Self-pay

## 2020-01-21 DIAGNOSIS — I10 Essential (primary) hypertension: Secondary | ICD-10-CM | POA: Diagnosis not present

## 2020-01-21 DIAGNOSIS — E785 Hyperlipidemia, unspecified: Secondary | ICD-10-CM

## 2020-01-21 DIAGNOSIS — E119 Type 2 diabetes mellitus without complications: Secondary | ICD-10-CM | POA: Diagnosis not present

## 2020-01-21 MED ORDER — FREESTYLE LIBRE 14 DAY READER DEVI: 0 refills | Status: DC

## 2020-01-21 MED ORDER — ROSUVASTATIN CALCIUM 40 MG PO TABS: 40.0000 mg | ORAL_TABLET | Freq: Every day | ORAL | 1 refills | Status: DC

## 2020-01-21 MED ORDER — FREESTYLE LIBRE 14 DAY SENSOR MISC: 1 refills | Status: DC

## 2020-01-21 MED ORDER — OZEMPIC (0.25 OR 0.5 MG/DOSE) 2 MG/1.5ML ~~LOC~~ SOPN: 0.5000 mg | PEN_INJECTOR | SUBCUTANEOUS | 1 refills | Status: DC

## 2020-01-21 NOTE — Assessment & Plan Note (Signed)
Increase Crestor.  Recheck in 6 weeks.

## 2020-01-21 NOTE — Assessment & Plan Note (Signed)
Well-controlled.  Continue current regimen. 

## 2020-01-21 NOTE — Telephone Encounter (Signed)
lvm pt needs 6 week follow up with labs

## 2020-01-21 NOTE — Progress Notes (Signed)
Virtual Visit via telephone Note  This visit type was conducted due to national recommendations for restrictions regarding the COVID-19 pandemic (e.g. social distancing).  This format is felt to be most appropriate for this patient at this time.  All issues noted in this document were discussed and addressed.  No physical exam was performed (except for noted visual exam findings with Video Visits).   I connected with Chad Frye today at  2:45 PM EDT by a video enabled telemedicine application or telephone and verified that I am speaking with the correct person using two identifiers. Location patient: [place] Location provider: work Persons participating in the virtual visit: patient, provider  I discussed the limitations, risks, security and privacy concerns of performing an evaluation and management service by telephone and the availability of in person appointments. I also discussed with the patient that there may be a patient responsible charge related to this service. The patient expressed understanding and agreed to proceed.  Interactive audio and video telecommunications were attempted between this provider and patient, however failed, due to patient having technical difficulties OR patient did not have access to video capability.  We continued and completed visit with audio only.   Reason for visit: f/u.  HPI: HYPERTENSION Disease Monitoring: Blood pressure range-120s/70s Chest pain- no      Dyspnea- no Medications: Compliance- taking amlodipine, benazepril, coreg, hctz   Edema- no  DIABETES Disease Monitoring: Blood Sugar ranges-fasting 108 Polyuria/phagia/dipsia- no      Optho- UTD Medications: Compliance- taking jardiance, metformin, ozempic 0.5 mg dose Hypoglycemic symptoms- no  HYPERLIPIDEMIA Disease Monitoring: See symptoms for Hypertension Medications: Compliance- taking crestor Right upper quadrant pain- no  Muscle aches- no   ROS: See pertinent  positives and negatives per HPI.  Past Medical History:  Diagnosis Date  . DM type 2 (diabetes mellitus, type 2) ([facility])   . Essential hypertension   . Hyperlipidemia   . Seasonal allergies     Past Surgical History:  Procedure Laterality Date  . APPENDECTOMY    . VASECTOMY      Family History  Problem Relation Age of Onset  . Diabetes Mother   . Heart disease Mother   . Stroke Father   . Prostate cancer Father   . Hypertension Father   . COPD Sister   . Heart disease Brother        single heart bypass  . Hypertension Maternal Grandmother     SOCIAL HX: smoker   Current Outpatient Medications:  .  amLODipine-benazepril (LOTREL) 10-40 MG capsule, TAKE 1 CAPSULE BY MOUTH EVERY DAY, Disp: 90 capsule, Rfl: 1 .  aspirin 81 MG tablet, Take 1 tablet by mouth daily., Disp: , Rfl:  .  b complex vitamins tablet, Take 1 tablet by mouth daily., Disp: , Rfl:  .  blood glucose meter kit and supplies KIT, Dispense based on patient and insurance preference. Use up to four times daily as directed. (FOR ICD-9 250.00, 250.01)., Disp: 1 each, Rfl: 0 .  carvedilol (COREG) 12.5 MG tablet, TAKE 1 TABLET (12.5 MG TOTAL) BY MOUTH 2 (TWO) TIMES DAILY WITH A MEAL., Disp: 180 tablet, Rfl: 1 .  Cinnamon 500 MG TABS, Take 8 tablets by mouth daily., Disp: , Rfl:  .  empagliflozin (JARDIANCE) 25 MG TABS tablet, Take 1 tablet (25 mg total) by mouth daily before breakfast., Disp: 90 tablet, Rfl: 1 .  hydrochlorothiazide (HYDRODIURIL) 25 MG tablet, TAKE 1 TABLET BY MOUTH EVERY DAY, Disp: 90 tablet,  Rfl: 1 .  metFORMIN (GLUCOPHAGE) 1000 MG tablet, TAKE 1 TABLET BY MOUTH EVERY MORNING AND 1 1/2 TABLETS BY MOUTH EVERY EVENING, Disp: 75 tablet, Rfl: 1 .  MULTIPLE VITAMIN PO, Take 1 tablet by mouth daily., Disp: , Rfl:  .  rosuvastatin (CRESTOR) 40 MG tablet, Take 1 tablet (40 mg total) by mouth daily., Disp: 90 tablet, Rfl: 1 .  tadalafil (CIALIS) 10 MG tablet, Please specify directions, refills and quantity,  Disp: 10 tablet, Rfl: 0 .  Continuous Blood Gluc Receiver (FREESTYLE LIBRE 14 DAY READER) DEVI, Use every 6 hours to check glucose, Disp: 1 each, Rfl: 0 .  Continuous Blood Gluc Sensor (FREESTYLE LIBRE 14 DAY SENSOR) MISC, Apply to skin once every 14 days., Disp: 6 each, Rfl: 1 .  Semaglutide,0.25 or 0.5MG/DOS, (OZEMPIC, 0.25 OR 0.5 MG/DOSE,) 2 MG/1.5ML SOPN, Inject 0.375 mLs (0.5 mg total) into the skin once a week., Disp: 6 mL, Rfl: 1  EXAM:  VITALS per patient if applicable:  GENERAL: alert, oriented, appears well and in no acute distress  HEENT: atraumatic, conjunttiva clear, no obvious abnormalities on inspection of external nose and ears  NECK: normal movements of the head and neck  LUNGS: on inspection no signs of respiratory distress, breathing rate appears normal, no obvious gross SOB, gasping or wheezing  CV: no obvious cyanosis  MS: moves all visible extremities without noticeable abnormality  PSYCH/NEURO: pleasant and cooperative, no obvious depression or anxiety, speech and thought processing grossly intact  ASSESSMENT AND PLAN:  Discussed the following assessment and plan:  Essential hypertension Well-controlled.  Continue current regimen.  Diabetes mellitus type 2, uncomplicated (HCC) Slightly above goal.  Has been unable to tolerate max dose of Ozempic.  Will see if his insurance will cover a CGM to see if we can identify dietary contributors to his elevated A1c as his fasting sugars are adequately controlled.  If this is affordable he will start using this.  If it is unaffordable he will let us know.  Hyperlipidemia Increase Crestor.  Recheck in 6 weeks.   Orders Placed This Encounter  Procedures  . LDL cholesterol, direct    Standing Status:   Future    Standing Expiration Date:   01/20/2021  . Hepatic function panel    Standing Status:   Future    Standing Expiration Date:   01/20/2021    Meds ordered this encounter  Medications  . Semaglutide,0.25 or  0.5MG/DOS, (OZEMPIC, 0.25 OR 0.5 MG/DOSE,) 2 MG/1.5ML SOPN    Sig: Inject 0.375 mLs (0.5 mg total) into the skin once a week.    Dispense:  6 mL    Refill:  1  . Continuous Blood Gluc Sensor (FREESTYLE LIBRE 14 DAY SENSOR) MISC    Sig: Apply to skin once every 14 days.    Dispense:  6 each    Refill:  1  . Continuous Blood Gluc Receiver (FREESTYLE LIBRE 14 DAY READER) DEVI    Sig: Use every 6 hours to check glucose    Dispense:  1 each    Refill:  0  . rosuvastatin (CRESTOR) 40 MG tablet    Sig: Take 1 tablet (40 mg total) by mouth daily.    Dispense:  90 tablet    Refill:  1     I discussed the assessment and treatment plan with the patient. The patient was provided an opportunity to ask questions and all were answered. The patient agreed with the plan and demonstrated an understanding of the  instructions.   The patient was advised to call back or seek an in-person evaluation if the symptoms worsen or if the condition fails to improve as anticipated.  I provided 10 minutes of non-face-to-face time during this encounter.   Tommi Rumps, MD

## 2020-01-21 NOTE — Assessment & Plan Note (Signed)
Slightly above goal.  Has been unable to tolerate max dose of Ozempic.  Will see if his insurance will cover a CGM to see if we can identify dietary contributors to his elevated A1c as his fasting sugars are adequately controlled.  If this is affordable he will start using this.  If it is unaffordable he will let us know.

## 2020-01-27 IMAGING — US US RENAL
1 series · 14 of 25 positions shown · non-contrast
Comparison: None

CLINICAL DATA: Proteinuria, type II diabetes mellitus, essential
hypertension

EXAM:
RENAL / URINARY TRACT ULTRASOUND COMPLETE

[Series 1: us renal · 0.23mm/px · 14 of 27 slices shown]
[im 1/27]
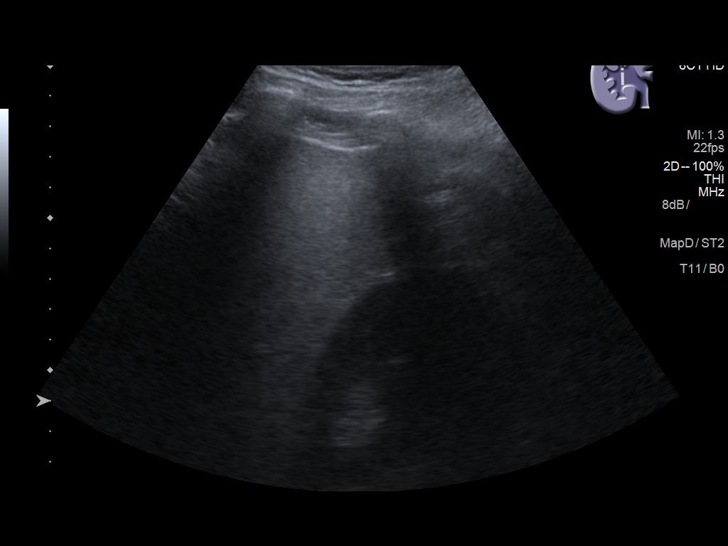
[im 3/27]
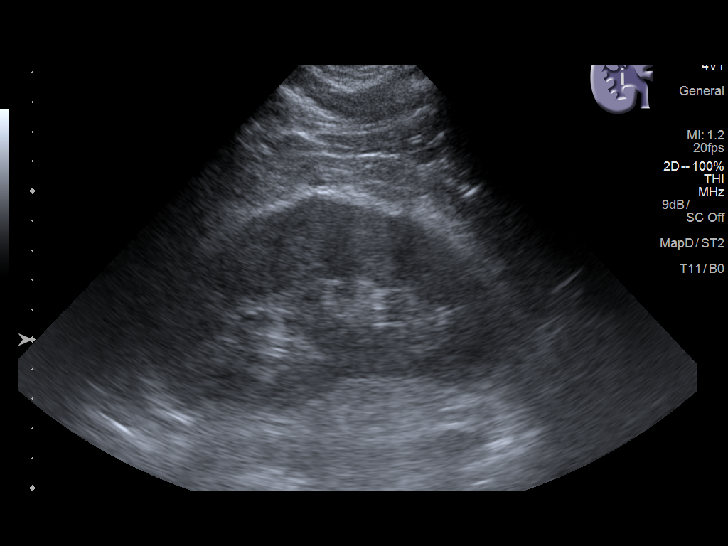
[im 5/27]
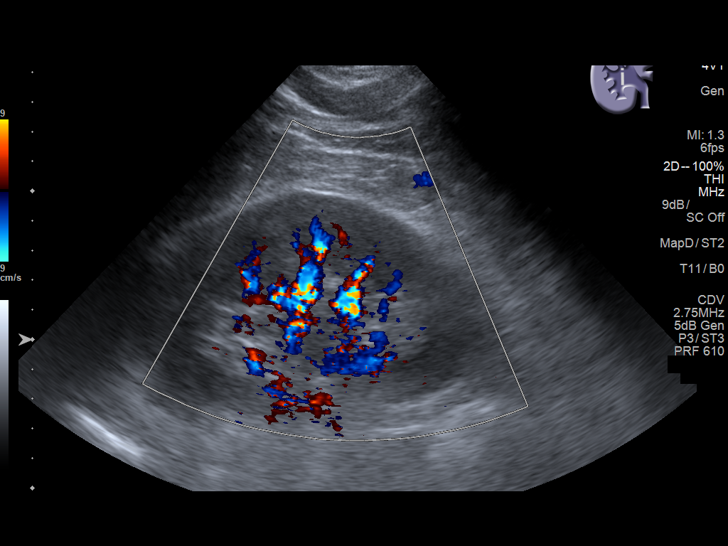
[im 7/27]
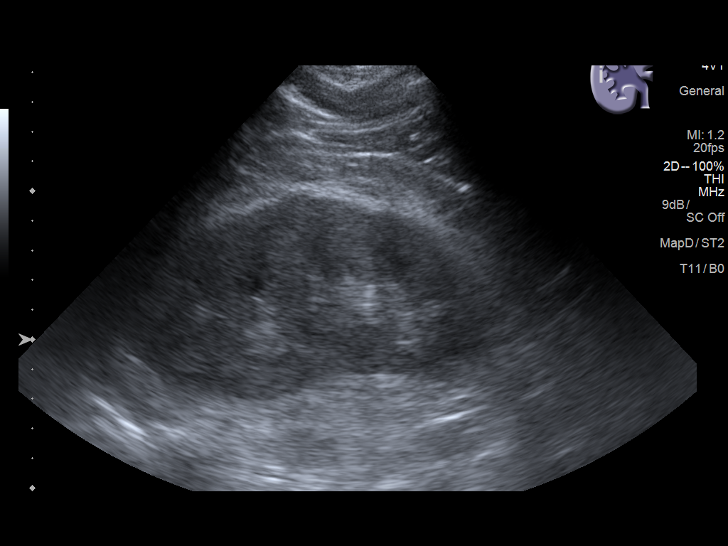
[im 9/27]
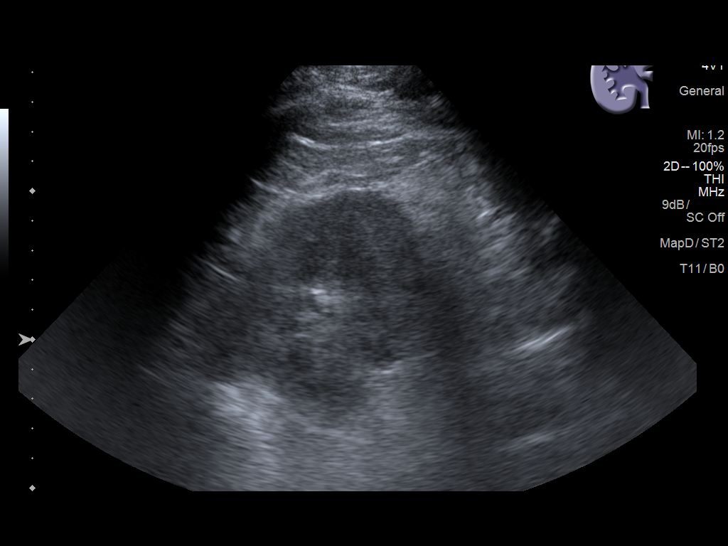
[im 10/27]
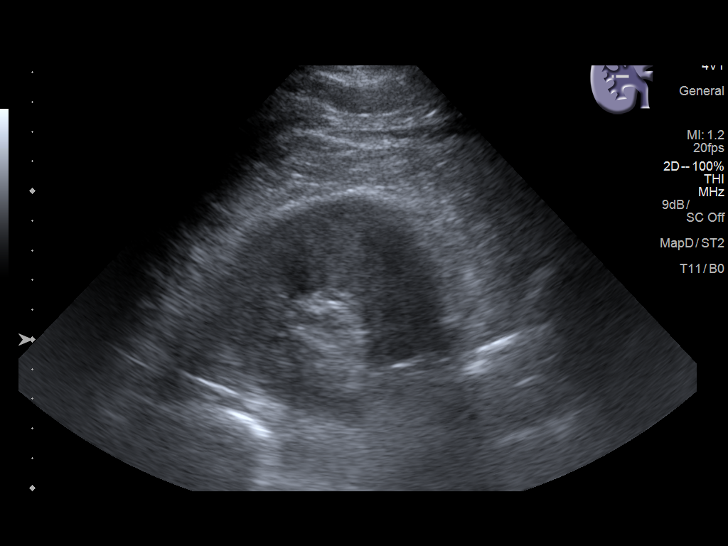
[im 12/27]
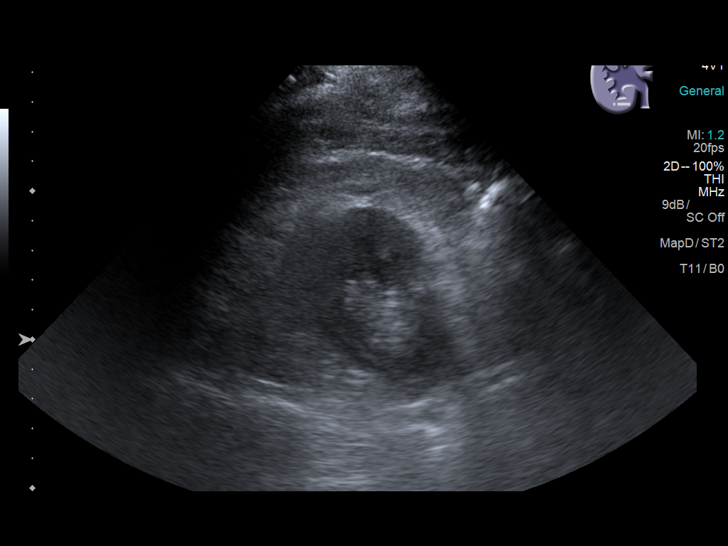
[im 15/27]
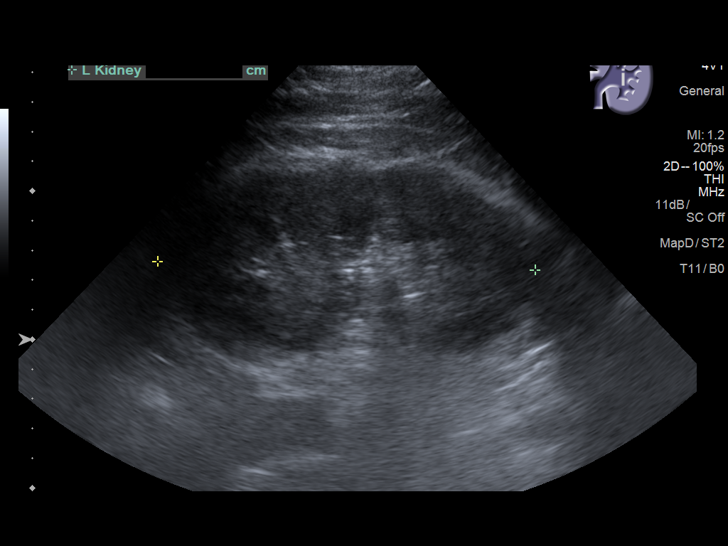
[im 17/27]
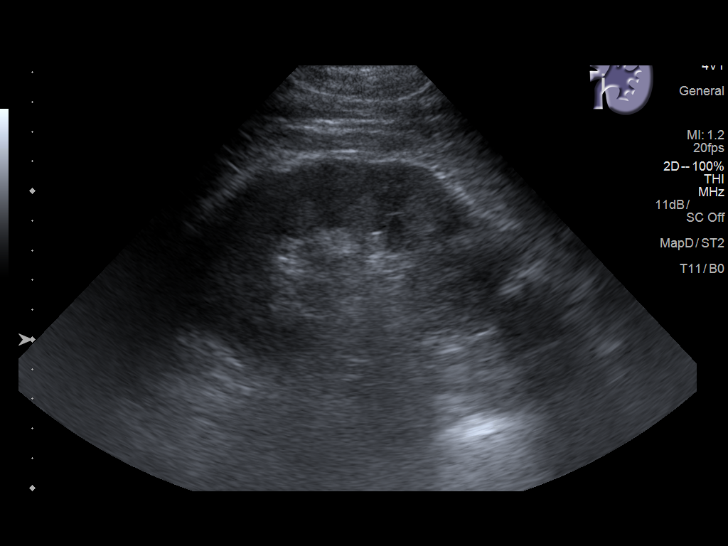
[im 18/27]
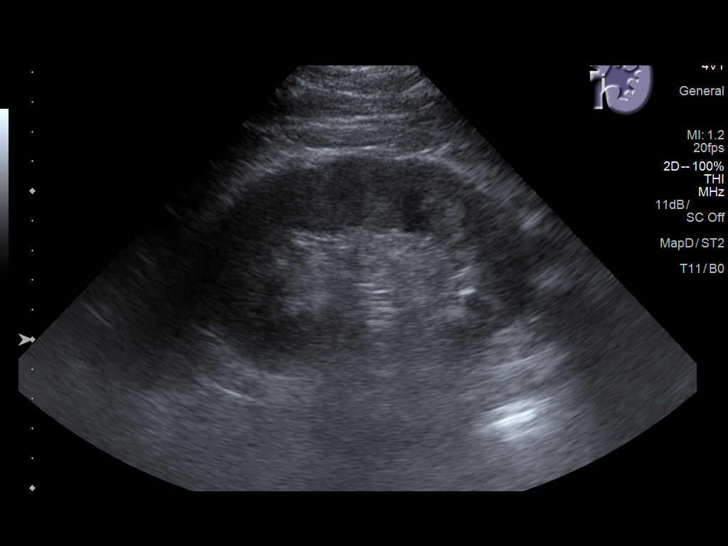
[im 20/27]
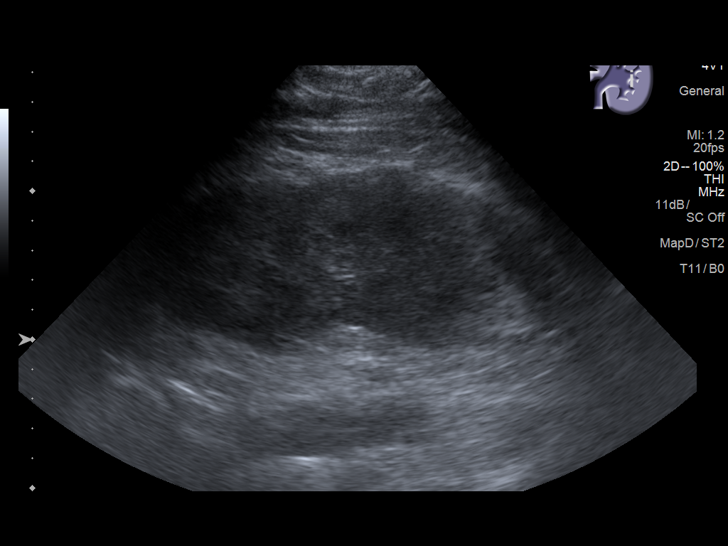
[im 22/27]
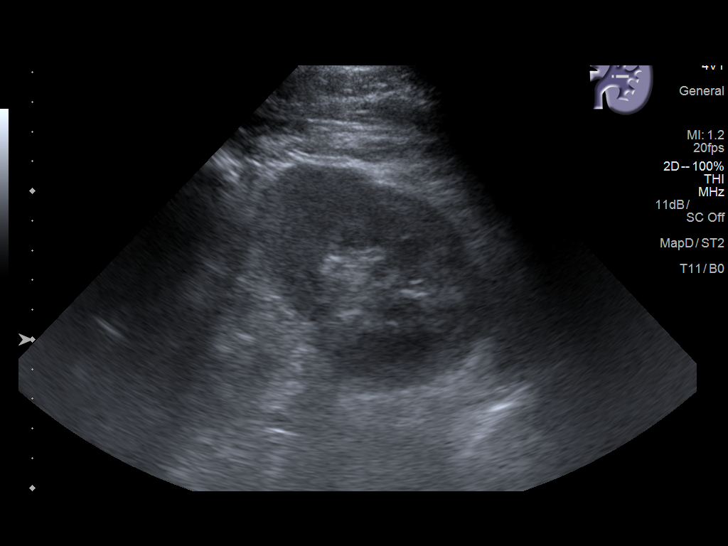
[im 24/27]
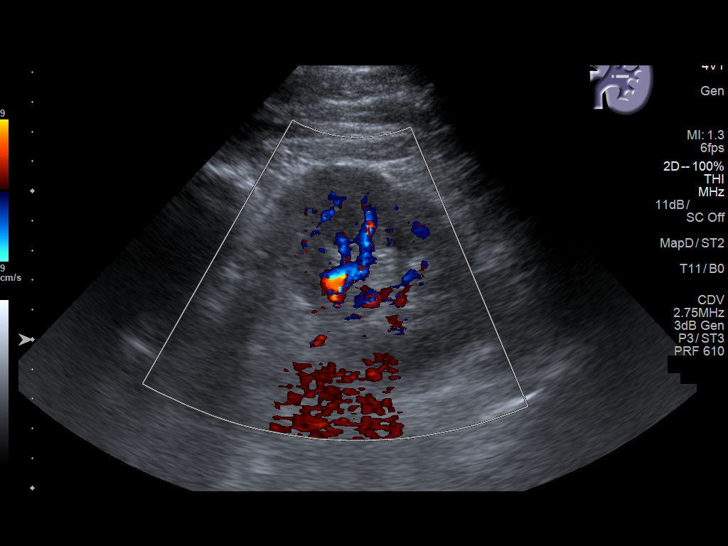
[im 27/27]
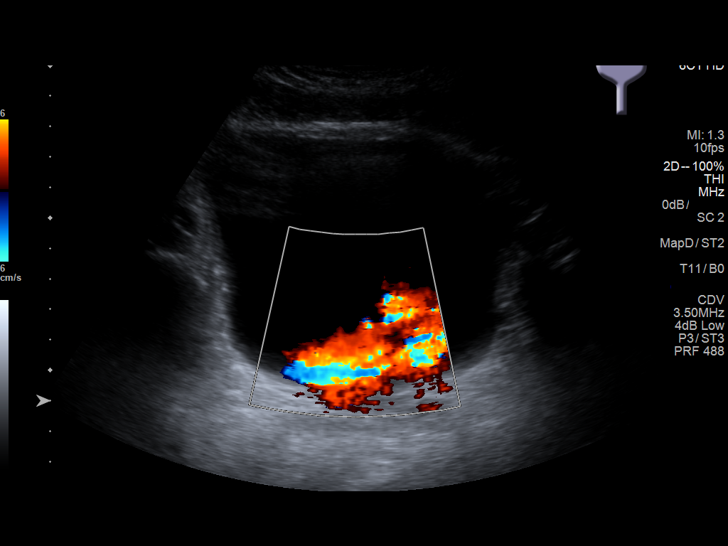

[14 of 25 positions shown; findings below may reference images not displayed]

FINDINGS: Right Kidney:

Length: 12.4 cm Normal cortical thickness. Minimally increased
cortical echogenicity. No mass, hydronephrosis or shadowing
calcification.

Left Kidney:

Length: 12.7 cm. Normal cortical thickness. Upper normal cortical
echogenicity. No mass, hydronephrosis or shadowing calcification.

Bladder:

Appears normal for degree of bladder distention.
IMPRESSION: Minimally increased cortical echogenicity RIGHT kidney which could
reflect subtle medical renal disease changes.

No evidence of renal mass or hydronephrosis.

## 2020-02-20 ENCOUNTER — Other Ambulatory Visit: Payer: Self-pay | Admitting: Family Medicine

## 2020-03-04 DIAGNOSIS — E119 Type 2 diabetes mellitus without complications: Secondary | ICD-10-CM | POA: Diagnosis not present

## 2020-03-04 LAB — HM DIABETES EYE EXAM

## 2020-03-14 ENCOUNTER — Other Ambulatory Visit: Payer: Self-pay | Admitting: Family Medicine

## 2020-04-17 ENCOUNTER — Other Ambulatory Visit: Payer: Self-pay | Admitting: Family Medicine

## 2020-04-20 ENCOUNTER — Other Ambulatory Visit: Payer: Self-pay | Admitting: Family Medicine

## 2020-05-25 ENCOUNTER — Telehealth: Payer: Self-pay | Admitting: Family Medicine

## 2020-05-26 MED ORDER — BLOOD GLUCOSE MONITOR KIT
PACK | 0 refills | Status: DC
Start: 2020-05-26 — End: 2020-05-31

## 2020-05-28 NOTE — Telephone Encounter (Signed)
Pt called to follow up on refills  Pt now sees Dr. Birdie Sons

## 2020-05-28 NOTE — Telephone Encounter (Signed)
Ok I got it. 

## 2020-05-28 NOTE — Telephone Encounter (Signed)
I am unable to fill these the rx will not print for me. Can you help by chance.

## 2020-05-31 ENCOUNTER — Encounter: Payer: Self-pay | Admitting: Family Medicine

## 2020-05-31 DIAGNOSIS — E119 Type 2 diabetes mellitus without complications: Secondary | ICD-10-CM

## 2020-05-31 MED ORDER — BLOOD GLUCOSE MONITOR KIT: PACK | 0 refills | Status: DC

## 2020-06-06 ENCOUNTER — Other Ambulatory Visit: Payer: Self-pay | Admitting: Family Medicine

## 2020-06-15 ENCOUNTER — Other Ambulatory Visit: Payer: Self-pay | Admitting: Family Medicine

## 2020-06-18 ENCOUNTER — Other Ambulatory Visit: Payer: Self-pay | Admitting: Family Medicine

## 2020-06-21 ENCOUNTER — Ambulatory Visit: Payer: BC Managed Care – PPO

## 2020-07-12 ENCOUNTER — Other Ambulatory Visit: Payer: Self-pay | Admitting: Family Medicine

## 2020-07-19 ENCOUNTER — Ambulatory Visit: Payer: BC Managed Care – PPO

## 2020-07-27 ENCOUNTER — Other Ambulatory Visit: Payer: Self-pay | Admitting: Family Medicine

## 2020-07-27 DIAGNOSIS — E785 Hyperlipidemia, unspecified: Secondary | ICD-10-CM

## 2020-08-02 ENCOUNTER — Ambulatory Visit: Payer: BC Managed Care – PPO | Admitting: Family Medicine

## 2020-08-09 ENCOUNTER — Other Ambulatory Visit: Payer: Self-pay | Admitting: Family Medicine

## 2020-08-12 ENCOUNTER — Other Ambulatory Visit: Payer: Self-pay | Admitting: Family Medicine

## 2020-08-12 DIAGNOSIS — E119 Type 2 diabetes mellitus without complications: Secondary | ICD-10-CM

## 2020-08-13 ENCOUNTER — Telehealth: Payer: Self-pay

## 2020-08-13 NOTE — Chronic Care Management (AMB) (Signed)
  Care Management   Note  08/13/2020 Name: MCKINNLEY SMITHEY MRN: [id number] DOB: 1960/12/10  LAWERNCE EARLL is a 60 y.o. year old male who is a primary care patient of Birdie Sons, Yehuda Mao, MD. I reached out to [name] by phone today in response to a referral sent by Mr. Pryor Ochoa Gebhard's health plan.    Mr. Brinegar was given information about care management services today including:  1. Care management services include personalized support from designated clinical staff supervised by his physician, including individualized plan of care and coordination with other care providers 2. 24/7 contact phone numbers for assistance for urgent and routine care needs. 3. The patient may stop care management services at any time by phone call to the office staff.  Patient agreed to services and verbal consent obtained.   Follow up plan: Telephone appointment with care management team member scheduled for:09/13/2020  Penne Lash, RMA Care Guide, Embedded Care Coordination [facility]  [place], [place] [address] Direct Dial: [phone] [email or web address]

## 2020-08-23 ENCOUNTER — Other Ambulatory Visit: Payer: Self-pay

## 2020-08-25 ENCOUNTER — Encounter: Payer: Self-pay | Admitting: Family Medicine

## 2020-08-25 ENCOUNTER — Ambulatory Visit: Payer: BC Managed Care – PPO | Admitting: Family Medicine

## 2020-08-25 ENCOUNTER — Other Ambulatory Visit: Payer: Self-pay

## 2020-08-25 VITALS — BP 121/80 | HR 76 | Temp 98.1°F | Ht 73.0 in | Wt 195.6 lb

## 2020-08-25 DIAGNOSIS — I10 Essential (primary) hypertension: Secondary | ICD-10-CM | POA: Diagnosis not present

## 2020-08-25 DIAGNOSIS — E119 Type 2 diabetes mellitus without complications: Secondary | ICD-10-CM

## 2020-08-25 DIAGNOSIS — R110 Nausea: Secondary | ICD-10-CM

## 2020-08-25 DIAGNOSIS — F17210 Nicotine dependence, cigarettes, uncomplicated: Secondary | ICD-10-CM

## 2020-08-25 DIAGNOSIS — R5383 Other fatigue: Secondary | ICD-10-CM

## 2020-08-25 DIAGNOSIS — Z1211 Encounter for screening for malignant neoplasm of colon: Secondary | ICD-10-CM

## 2020-08-25 LAB — COMPREHENSIVE METABOLIC PANEL
ALT: 19 U/L (ref 0–53)
AST: 15 U/L (ref 0–37)
Albumin: 4.5 g/dL (ref 3.5–5.2)
Alkaline Phosphatase: 94 U/L (ref 39–117)
BUN: 19 mg/dL (ref 6–23)
CO2: 25 mEq/L (ref 19–32)
Calcium: 9.7 mg/dL (ref 8.4–10.5)
Chloride: 98 mEq/L (ref 96–112)
Creatinine, Ser: 0.85 mg/dL (ref 0.40–1.50)
GFR: 95.75 mL/min (ref >60.00)
Glucose, Bld: 123 mg/dL — ABNORMAL HIGH (ref 70–99)
Potassium: 4.2 mEq/L (ref 3.5–5.1)
Sodium: 134 mEq/L — ABNORMAL LOW (ref 135–145)
Total Bilirubin: 0.4 mg/dL (ref 0.2–1.2)
Total Protein: 6.9 g/dL (ref 6.0–8.3)

## 2020-08-25 LAB — CBC WITH DIFFERENTIAL/PLATELET
Basophils Absolute: 0.1 10*3/uL (ref 0.0–0.1)
Basophils Relative: 0.7 % (ref 0.0–3.0)
Eosinophils Absolute: 0.1 10*3/uL (ref 0.0–0.7)
Eosinophils Relative: 1.4 % (ref 0.0–5.0)
HCT: 44.7 % (ref 39.0–52.0)
Hemoglobin: 15.3 g/dL (ref 13.0–17.0)
Lymphocytes Relative: 17.2 % (ref 12.0–46.0)
Lymphs Abs: 1.6 10*3/uL (ref 0.7–4.0)
MCHC: 34.1 g/dL (ref 30.0–36.0)
MCV: 87.8 fl (ref 78.0–100.0)
Monocytes Absolute: 0.7 10*3/uL (ref 0.1–1.0)
Monocytes Relative: 7.4 % (ref 3.0–12.0)
Neutro Abs: 6.9 10*3/uL (ref 1.4–7.7)
Neutrophils Relative %: 73.3 % (ref 43.0–77.0)
Platelets: 290 10*3/uL (ref 150.0–400.0)
RBC: 5.09 Mil/uL (ref 4.22–5.81)
RDW: 13.1 % (ref 11.5–15.5)
WBC: 9.4 10*3/uL (ref 4.0–10.5)

## 2020-08-25 LAB — TSH: TSH: 1.58 u[IU]/mL (ref 0.35–4.50)

## 2020-08-25 LAB — HEMOGLOBIN A1C: Hgb A1c MFr Bld: 7.2 % — ABNORMAL HIGH (ref 4.6–6.5)

## 2020-08-25 LAB — VITAMIN B12: Vitamin B-12: 264 pg/mL (ref 211–911)

## 2020-08-25 LAB — VITAMIN D 25 HYDROXY (VIT D DEFICIENCY, FRACTURES): VITD: 33.78 ng/mL (ref 30.00–100.00)

## 2020-08-25 NOTE — Progress Notes (Signed)
Tommi Rumps, MD Phone: [phone]  Chad Frye is a 60 y.o. male who presents today for f/u.  DIABETES Disease Monitoring: Blood Sugar ranges-100s, excursion to 150s right after he eats Polyuria/phagia/dipsia- no      Optho- UTD Medications: Compliance- taking jardiance, ozempic, metformin Hypoglycemic symptoms- no  HYPERTENSION  Disease Monitoring  Home BP Monitoring 118/78 Chest pain- no    Dyspnea- no Medications  Compliance-  Taking amlodipine, benazepril, coreg, HCTZ.  Edema- no  Tiredness: Patient notes this has been going on for years.  He has also had dry skin and cold intolerance.  He notes he quit Metformin for several weeks about 6 months ago and the symptoms improved significantly.  Nausea: Patient notes this resolved.  He figured out he was taking in too much cinnamon.  He decreased that and has not had recurrence.     Social History   Tobacco Use  Smoking Status Current Every Day Smoker  . Last attempt to quit: 11/28/2015  . Years since quitting: 4.7  Smokeless Tobacco Never Used    Current Outpatient Medications on File Prior to Visit  Medication Sig Dispense Refill  . ACCU-CHEK GUIDE test strip USE AS DIRECTED 4 TIMES A DAY 100 strip 1  . amLODipine-benazepril (LOTREL) 10-40 MG capsule TAKE 1 CAPSULE BY MOUTH EVERY DAY 90 capsule 1  . aspirin 81 MG tablet Take 1 tablet by mouth daily.    Marland Kitchen b complex vitamins tablet Take 1 tablet by mouth daily.    . blood glucose meter kit and supplies KIT Dispense based on patient and insurance preference. Use up to four times daily as directed. (FOR ICD-9 250.00, 250.01). 1 each 0  . carvedilol (COREG) 12.5 MG tablet TAKE 1 TABLET (12.5 MG TOTAL) BY MOUTH 2 (TWO) TIMES DAILY WITH A MEAL. 180 tablet 1  . Cinnamon 500 MG TABS Take 8 tablets by mouth daily.    . Continuous Blood Gluc Receiver (FREESTYLE LIBRE 14 DAY READER) DEVI Use every 6 hours to check glucose 1 each 0  . Continuous Blood Gluc Sensor (FREESTYLE  LIBRE 14 DAY SENSOR) MISC Apply to skin once every 14 days. 6 each 1  . hydrochlorothiazide (HYDRODIURIL) 25 MG tablet TAKE 1 TABLET BY MOUTH EVERY DAY 90 tablet 1  . JARDIANCE 25 MG TABS tablet TAKE 1 TABLET (25 MG TOTAL) BY MOUTH DAILY BEFORE BREAKFAST. 90 tablet 1  . MULTIPLE VITAMIN PO Take 1 tablet by mouth daily.    . rosuvastatin (CRESTOR) 40 MG tablet TAKE 1 TABLET BY MOUTH EVERY DAY 90 tablet 1  . Semaglutide,0.25 or 0.5MG/DOS, (OZEMPIC, 0.25 OR 0.5 MG/DOSE,) 2 MG/1.5ML SOPN Inject 0.375 mLs (0.5 mg total) into the skin once a week. 6 mL 1  . tadalafil (CIALIS) 10 MG tablet Please specify directions, refills and quantity 10 tablet 0  . Accu-Chek Softclix Lancets lancets SMARTSIG:Topical     No current facility-administered medications on file prior to visit.     ROS see history of present illness  Objective  Physical Exam Vitals:   08/25/20 0806  BP: 121/80  Pulse: 76  Temp: 98.1 F (36.7 C)  SpO2: 98%    BP Readings from Last 3 Encounters:  08/25/20 121/80  11/19/19 130/70  10/15/19 120/80   Wt Readings from Last 3 Encounters:  08/25/20 195 lb 9.6 oz (88.7 kg)  01/21/20 192 lb (87.1 kg)  11/19/19 195 lb 6.4 oz (88.6 kg)    Physical Exam Constitutional:      General:  He is not in acute distress.    Appearance: He is not diaphoretic.  Cardiovascular:     Rate and Rhythm: Normal rate and regular rhythm.     Heart sounds: Normal heart sounds.  Pulmonary:     Effort: Pulmonary effort is normal.     Breath sounds: Normal breath sounds.  Musculoskeletal:     Right lower leg: No edema.     Left lower leg: No edema.  Skin:    General: Skin is warm and dry.  Neurological:     Mental Status: He is alert.      Assessment/Plan: Please see individual problem list.  Problem List Items Addressed This Visit    Colon cancer screening   Relevant Orders   Cologuard   Diabetes mellitus type 2, uncomplicated (HCC)    Check A1c.  Continue Jardiance 25 mg daily.   Continue Ozempic 0.5 mg weekly.  He will discontinue Metformin as it may be contributing to his symptoms of being tired.  We will see him back in 3 months for an A1c.      Relevant Orders   HgB A1c   Essential hypertension - Primary    Adequate control.  Continue amlodipine/benazepril 1 tablet daily, carvedilol 12.5 mg twice daily, and HCTZ 25 mg daily.      Relevant Orders   Comp Met (CMET)   Nausea    Likely related to his cinnamon intake.  Has improved with decreasing this.  He will monitor.      Nicotine dependence, cigarettes, uncomplicated    Counseled on smoking cessation.  Patient is aware of the risk of continued smoking.  Discussed lung cancer screening CT though he defers this at this time.      Tiredness    Ongoing issue.  Potentially could be thyroid related given his description of symptoms.  Metformin could potentially be causing this though these are not typical side effects.  We will have him discontinue his Metformin.  We will check lab work as outlined below.      Relevant Orders   TSH   CBC w/Diff   Vitamin D (25 hydroxy)   B12       Health Maintenance: Cologuard ordered.  Patient denies blood in his stool, history of polyps, and family history of colon cancer.  The patient defers lung cancer screening CT at this time.     This visit occurred during the SARS-CoV-2 public health emergency.  Safety protocols were in place, including screening questions prior to the visit, additional usage of staff PPE, and extensive cleaning of exam room while observing appropriate contact time as indicated for disinfecting solutions.    Tommi Rumps, MD [place]

## 2020-08-25 NOTE — Patient Instructions (Signed)
Nice to see you. We will check lab work today. Please discontinue your Metformin.

## 2020-08-25 NOTE — Assessment & Plan Note (Signed)
Check A1c.  Continue Jardiance 25 mg daily.  Continue Ozempic 0.5 mg weekly.  He will discontinue Metformin as it may be contributing to his symptoms of being tired.  We will see him back in 3 months for an A1c.

## 2020-08-25 NOTE — Assessment & Plan Note (Signed)
Counseled on smoking cessation.  Patient is aware of the risk of continued smoking.  Discussed lung cancer screening CT though he defers this at this time.

## 2020-08-25 NOTE — Assessment & Plan Note (Signed)
Adequate control.  Continue amlodipine/benazepril 1 tablet daily, carvedilol 12.5 mg twice daily, and HCTZ 25 mg daily.

## 2020-08-25 NOTE — Assessment & Plan Note (Signed)
Ongoing issue.  Potentially could be thyroid related given his description of symptoms.  Metformin could potentially be causing this though these are not typical side effects.  We will have him discontinue his Metformin.  We will check lab work as outlined below.

## 2020-08-25 NOTE — Assessment & Plan Note (Signed)
Likely related to his cinnamon intake.  Has improved with decreasing this.  He will monitor.

## 2020-08-30 ENCOUNTER — Other Ambulatory Visit: Payer: Self-pay | Admitting: Family Medicine

## 2020-08-30 MED ORDER — OZEMPIC (1 MG/DOSE) 2 MG/1.5ML ~~LOC~~ SOPN: 1.0000 mg | PEN_INJECTOR | SUBCUTANEOUS | 1 refills | Status: DC

## 2020-09-13 ENCOUNTER — Ambulatory Visit: Payer: BC Managed Care – PPO | Admitting: Pharmacist

## 2020-09-13 DIAGNOSIS — E119 Type 2 diabetes mellitus without complications: Secondary | ICD-10-CM

## 2020-09-13 DIAGNOSIS — I10 Essential (primary) hypertension: Secondary | ICD-10-CM

## 2020-09-13 DIAGNOSIS — H9012 Conductive hearing loss, unilateral, left ear, with unrestricted hearing on the contralateral side: Secondary | ICD-10-CM | POA: Diagnosis not present

## 2020-09-13 DIAGNOSIS — H6122 Impacted cerumen, left ear: Secondary | ICD-10-CM | POA: Diagnosis not present

## 2020-09-13 DIAGNOSIS — E785 Hyperlipidemia, unspecified: Secondary | ICD-10-CM

## 2020-09-13 NOTE — Chronic Care Management (AMB) (Addendum)
Care Management   Pharmacy Note  09/13/2020 Name: Chad Frye MRN: [id number] DOB: [birth year]  Subjective: Chad Frye is Frye 60 y.o. year old male who is Frye primary care patient of Chad Frye, Chad Adam, MD. The Care Management team was consulted for assistance with care management and care coordination needs.    Engaged with patient by telephone for initial visit in response to provider referral for pharmacy case management and/or care coordination services.   The patient was given information about Care Management services today including:  1. Care Management services includes personalized support from designated clinical staff supervised by the patient's primary care provider, including individualized plan of care and coordination with other care providers. 2. 24/7 contact phone numbers for assistance for urgent and routine care needs. 3. The patient may stop case management services at any time by phone call to the office staff.  Patient agreed to services and consent obtained.  Assessment:  Review of patient status, including review of consultants reports, laboratory and other test data, was performed as part of comprehensive evaluation and provision of chronic care management services.   SDOH (Social Determinants of Health) assessments and interventions performed:  SDOH Interventions   Flowsheet Row Most Recent Value  SDOH Interventions   Financial Strain Interventions Intervention Not Indicated       Objective:  Lab Results  Component Value Date   CREATININE 0.85 08/25/2020   CREATININE 0.85 01/16/2020   CREATININE 0.81 11/19/2019    Lab Results  Component Value Date   HGBA1C 7.2 (H) 08/25/2020       Component Value Date/Time   CHOL 138 01/16/2020 0907   TRIG 163.0 (H) 01/16/2020 0907   HDL 33.10 (L) 01/16/2020 0907   CHOLHDL 4 01/16/2020 0907   VLDL 32.6 01/16/2020 0907   LDLCALC 72 01/16/2020 0907   LDLCALC 113 (H) 07/27/2017 1601   LDLDIRECT 171.0  11/26/2018 0837    Clinical ASCVD: No  The 10-year ASCVD risk score Chad Frye DC Jr., Chad al., 2013) is: 22.5%   Values used to calculate the score:     Age: 60 years     Sex: Male     Is Non-Hispanic African American: No     Diabetic: Yes     Tobacco smoker: Yes     Systolic Blood Pressure: 937 mmHg     Is BP treated: Yes     HDL Cholesterol: 33.1 mg/dL     Total Cholesterol: 138 mg/dL     BP Readings from Last 3 Encounters:  08/25/20 121/80  11/19/19 130/70  10/15/19 120/80    Care Plan  No Known Allergies  Medications Reviewed Today    Reviewed by Chad Frye (Pharmacist) on 09/13/20 at 1137  Med List Status: <None>  Medication Order Taking? Sig Documenting Provider Last Dose Status Informant  ACCU-CHEK GUIDE test strip [id number]  USE AS DIRECTED 4 TIMES Frye DAY Chad Haven, MD  Active   Accu-Chek Softclix Lancets lancets [id number]  SMARTSIG:Topical [provider]  Active   amLODipine-benazepril (LOTREL) 10-40 MG capsule [id number] Yes TAKE 1 CAPSULE BY MOUTH EVERY DAY Chad Haven, MD Taking Active   aspirin 81 MG tablet [id number] Yes Take 1 tablet by mouth daily. [provider] Taking Active            Med Note [name]   Fri Oct 27, 2016  3:46 PM)    b complex vitamins tablet [id number] Yes Take 1 tablet by mouth  daily. [provider] Taking Active   blood glucose meter kit and supplies KIT [id number]  Dispense based on patient and insurance preference. Use up to four times daily as directed. (FOR ICD-9 250.00, 250.01). Chad Haven, MD  Active   carvedilol (COREG) 12.5 MG tablet [id number] Yes TAKE 1 TABLET (12.5 MG TOTAL) BY MOUTH 2 (TWO) TIMES DAILY WITH Frye MEAL. Chad Haven, MD Taking Active   Cinnamon 500 MG TABS [id number] Yes Take 8 tablets by mouth daily. [provider] Taking Active            Med Note (Chad Frye   Mon Sep 13, 2020 11:18 AM) Taking 1 tablet BID  hydrochlorothiazide  (HYDRODIURIL) 25 MG tablet [id number] Yes TAKE 1 TABLET BY MOUTH EVERY DAY Chad Haven, MD Taking Active   JARDIANCE 25 MG TABS tablet [id number] Yes TAKE 1 TABLET (25 MG TOTAL) BY MOUTH DAILY BEFORE BREAKFAST. Chad Haven, MD Taking Active   MULTIPLE VITAMIN PO [id number] Yes Take 1 tablet by mouth daily. [provider] Taking Active            Med Note [name]   Fri Oct 27, 2016  3:57 PM)    rosuvastatin (CRESTOR) 40 MG tablet [id number] Yes TAKE 1 TABLET BY MOUTH EVERY DAY Chad Haven, MD Taking Active   Semaglutide, 1 MG/DOSE, (OZEMPIC, 1 MG/DOSE,) 2 MG/1.5ML SOPN [id number] Yes Inject 1 mg into the skin once Frye week. Chad Haven, MD Taking Active   tadalafil (CIALIS) 10 MG tablet [id number] No Please specify directions, refills and quantity  Patient not taking: Reported on 09/13/2020   Chad Haven, MD Not Taking Active           Patient Active Problem List   Diagnosis Date Noted  . Nausea 11/19/2019  . Nicotine dependence, cigarettes, uncomplicated 42/35/3614  . Colon cancer screening 06/09/2019  . Tiredness 03/05/2019  . Microalbuminuria 01/14/2018  . Diabetes mellitus type 2, uncomplicated ([place]) 43/15/4008  . ED (erectile dysfunction) of organic origin 02/15/2015  . Essential hypertension 02/15/2015  . Hyperlipidemia 02/15/2015    Conditions to be addressed/monitored: HTN, HLD and DMII  Care Plan : Medication Management  Updates made by Chad Frye, Chad Frye since 09/13/2020 12:00 AM    Problem: T2DM, HTN, HLD, ED   Priority: High  Onset Date: 09/13/2020    Long-Range Goal: Disease Progression Prevention   Start Date: 09/13/2020  This Visit's Progress: On track  Priority: High  Note:   Current Barriers:  . Unable to achieve control of chronic diseases (diabetes, hypertension, hyperlipidemia)  Pharmacist Clinical Goal(s):  Marland Kitchen Over the next 90 days, patient will achieve adherence to monitoring guidelines and medication  adherence to achieve therapeutic efficacy and achieve control of diabetes, hypertension, and hyperlipidemia as evidenced by A1c goal <7% and BG goal <130/80, LDL goal <100 through collaboration with PharmD and provider.   Interventions: . 1:1 collaboration with Chad Haven, MD regarding development and update of comprehensive plan of care as evidenced by provider attestation and co-signature . Inter-disciplinary care team collaboration (see longitudinal plan of care) . Comprehensive medication review performed; medication list updated in electronic medical record  Diabetes: . Uncontrolled; current treatment: Ozempic 1 mg weekly, Jardiance 25 mg daily o Hx of metformin use - recently discontinued at last PCP visit on 4/6 due to feeling bad, fatigue, and no energy. Denied GI issues with metformin . Current glucose readings: fasting glucose: 106-134, post prandial  glucose: 100-130s (checks occasionally) o Insurance will not cover Libre-CGM as patient is not on insulin therapy . Denies hypoglycemic symptoms . Current meal patterns: breakfast - cereal, breakfast burrito; lunch - snacks (cucumbers, snack cake), hamburger; dinner - frozen meals (chicken and broccoli alfredo), salads; snacks - cashews, pretzels; drinks - caffeinated coffee (2 large cups), tea, pepsi-zero, and water . Exercise: walks Frye couple times Frye day up to 15 minutes . Social/Job: Long distance truck driver . Recommended use of [facility]. Patient agreeable to enroll. Provided customer service [phone] . Recommended to continue current regimen   Hypertension: . Controlled; current treatment: amlodipine-benazepril 10-40 mg QD, carvedilol 12.5 mg BID, HCTZ 25 mg daily . Current home readings: 118-124/78-80 . Denies headaches, dizziness, and blurry vision . Recommended to continue current regimen  Hyperlipidemia: . Controlled; current treatment: rosuvastatin 40 mg daily . Antiplatelet therapy:  aspirin 81 mg daily . Recommended to continue current regimen  Erectile Dysfunction: . Controlled; current treatment: tadalafil 10 mg PRN  . Recommended to continue current regimen  Supplementation: . Current treatment: cinnamon, vitamin B, multivitamin . No identifiable drug interactions . Recommended to continue current regimen  Patient Goals/Self-Care Activities . Over the next 90 days, patient will:  - take medications as prescribed check glucose BID, document, and provide at future appointments check blood pressure daily, document, and provide at future appointments  Follow Up Plan: Telephone follow up appointment with care management team member scheduled for: ~4 weeks      Medication Assistance:  None required.  Patient affirms current coverage meets needs.  Follow Up:  Patient agrees to Care Plan and Follow-up.  Plan: Telephone follow up appointment with care management team member scheduled for:  ~4 weeks  Chad Frye, PharmD, BCPS PGY2 [place]   I was present for this visit and agree with the documentation by the resident as above.   Chad Frye, PharmD, [place], [place] Clinical Pharmacist [employer] at [place]

## 2020-09-13 NOTE — Patient Instructions (Signed)
  Visit Information  PATIENT GOALS:  Goals Addressed              This Visit's Progress   .  Medication Management (pt-stated)        Patient Goals/Self-Care Activities . Over the next 90 days, patient will:  - take medications as prescribed check glucose BID, document, and provide at future appointments check blood pressure daily, document, and provide at future appointments        Chad Frye was given information about Care Management services by the embedded care coordination team including:  1. Care Management services include personalized support from designated clinical staff supervised by his physician, including individualized plan of care and coordination with other care providers 2. 24/7 contact phone numbers for assistance for urgent and routine care needs. 3. The patient may stop CCM services at any time (effective at the end of the month) by phone call to the office staff.  Patient agreed to services and verbal consent obtained.   Patient verbalizes understanding of instructions provided today and agrees to view in MyChart.   Telephone follow up appointment with care management team member scheduled for: ~4 weeks  Fabio Neighbors, PharmD, BCPS PGY2 Ambulatory Care Resident [facility]  Pharmacy

## 2020-10-11 ENCOUNTER — Telehealth: Payer: BC Managed Care – PPO

## 2020-10-19 ENCOUNTER — Ambulatory Visit: Payer: BC Managed Care – PPO | Admitting: Pharmacist

## 2020-10-19 DIAGNOSIS — E119 Type 2 diabetes mellitus without complications: Secondary | ICD-10-CM

## 2020-10-19 DIAGNOSIS — I10 Essential (primary) hypertension: Secondary | ICD-10-CM

## 2020-10-19 DIAGNOSIS — E785 Hyperlipidemia, unspecified: Secondary | ICD-10-CM

## 2020-10-19 NOTE — Chronic Care Management (AMB) (Signed)
Care Management   Pharmacy Note  10/19/2020 Name: Chad Frye MRN: [id number] DOB: 06-25-1960  Subjective: Chad Frye is a 60 y.o. year old male who is a primary care patient of [name], [name], MD. The Care Management team was consulted for assistance with care management and care coordination needs.    Engaged with patient by telephone for follow up visit in response to provider referral for pharmacy case management and/or care coordination services.   The patient was given information about Care Management services today including:  1. Care Management services includes personalized support from designated clinical staff supervised by the patient's primary care provider, including individualized plan of care and coordination with other care providers. 2. 24/7 contact phone numbers for assistance for urgent and routine care needs. 3. The patient may stop case management services at any time by phone call to the office staff.  Patient agreed to services and consent obtained.  Assessment:  Review of patient status, including review of consultants reports, laboratory and other test data, was performed as part of comprehensive evaluation and provision of chronic care management services.   SDOH (Social Determinants of Health) assessments and interventions performed:    Objective:  Lab Results  Component Value Date   CREATININE 0.85 08/25/2020   CREATININE 0.85 01/16/2020   CREATININE 0.81 11/19/2019    Lab Results  Component Value Date   HGBA1C 7.2 (H) 08/25/2020       Component Value Date/Time   CHOL 138 01/16/2020 0907   TRIG 163.0 (H) 01/16/2020 0907   HDL 33.10 (L) 01/16/2020 0907   CHOLHDL 4 01/16/2020 0907   VLDL 32.6 01/16/2020 0907   LDLCALC 72 01/16/2020 0907   LDLCALC 113 (H) 07/27/2017 1601   LDLDIRECT 171.0 11/26/2018 0837   Clinical ASCVD: No  The 10-year ASCVD risk score [name], et al., 2013) is: 22.5%   Values used to calculate the score:      Age: 47 years     Sex: Male     Is Non-Hispanic African American: No     Diabetic: Yes     Tobacco smoker: Yes     Systolic Blood Pressure: 998 mmHg     Is BP treated: Yes     HDL Cholesterol: 33.1 mg/dL     Total Cholesterol: 138 mg/dL     BP Readings from Last 3 Encounters:  08/25/20 121/80  11/19/19 130/70  10/15/19 120/80    Care Plan  No Known Allergies  Medications Reviewed Today    Reviewed by [name], RPH-CPP (Pharmacist) on 10/19/20 at 1104  Med List Status: <None>  Medication Order Taking? Sig Documenting Provider Last Dose Status Informant  ACCU-CHEK GUIDE test strip [id number] Yes USE AS DIRECTED 4 TIMES A DAY [name], MD Taking Active   Accu-Chek Softclix Lancets lancets [id number] Yes SMARTSIG:Topical [provider] Taking Active   amLODipine-benazepril (LOTREL) 10-40 MG capsule [id number] Yes TAKE 1 CAPSULE BY MOUTH EVERY DAY [name], MD Taking Active   aspirin 81 MG tablet [id number] Yes Take 1 tablet by mouth daily. [provider] Taking Active            Med Note [name]   Fri Oct 27, 2016  3:46 PM)    b complex vitamins tablet [id number] Yes Take 1 tablet by mouth daily. [provider] Taking Active   blood glucose meter kit and supplies KIT [id number] Yes Dispense based on patient and insurance preference.  Use up to four times daily as directed. (FOR ICD-9 250.00, 250.01). [name], MD Taking Active   carvedilol (COREG) 12.5 MG tablet [id number] Yes TAKE 1 TABLET (12.5 MG TOTAL) BY MOUTH 2 (TWO) TIMES DAILY WITH A MEAL. [name], MD Taking Active   Cinnamon 500 MG TABS [id number] Yes Take 1 tablet by mouth in the morning and at bedtime. [provider] Taking Active            Med Note [name]   Tue Oct 19, 2020 11:04 AM)    hydrochlorothiazide (HYDRODIURIL) 25 MG tablet [id number] Yes TAKE 1 TABLET BY MOUTH EVERY DAY [name], MD Taking  Active   JARDIANCE 25 MG TABS tablet [id number] Yes TAKE 1 TABLET (25 MG TOTAL) BY MOUTH DAILY BEFORE BREAKFAST. [name], MD Taking Active   MULTIPLE VITAMIN PO [id number] Yes Take 1 tablet by mouth daily. [provider] Taking Active            Med Note [name]   Fri Oct 27, 2016  3:57 PM)    rosuvastatin (CRESTOR) 40 MG tablet [id number] Yes TAKE 1 TABLET BY MOUTH EVERY DAY [name], MD Taking Active   Semaglutide, 1 MG/DOSE, (OZEMPIC, 1 MG/DOSE,) 2 MG/1.5ML SOPN [id number] Yes Inject 1 mg into the skin once a week. [name], MD Taking Active   tadalafil (CIALIS) 10 MG tablet [id number] No Please specify directions, refills and quantity  Patient not taking: No sig reported   [name], MD Not Taking Active           Patient Active Problem List   Diagnosis Date Noted  . Nausea 11/19/2019  . Nicotine dependence, cigarettes, uncomplicated 75/64/3329  . Colon cancer screening 06/09/2019  . Tiredness 03/05/2019  . Microalbuminuria 01/14/2018  . Diabetes mellitus type 2, uncomplicated ([place]) 51/88/4166  . ED (erectile dysfunction) of organic origin 02/15/2015  . Essential hypertension 02/15/2015  . Hyperlipidemia 02/15/2015    Conditions to be addressed/monitored: HTN, HLD and DMII  Care Plan : Medication Management  Updates made by [name], RPH-CPP since 10/19/2020 12:00 AM    Problem: T2DM, HTN, HLD, ED   Priority: High  Onset Date: 09/13/2020    Long-Range Goal: Disease Progression Prevention   Start Date: 09/13/2020  This Visit's Progress: On track  Recent Progress: On track  Priority: High  Note:   Current Barriers:  . Unable to achieve control of chronic diseases (diabetes, hypertension, hyperlipidemia)  Pharmacist Clinical Goal(s):  Marland Kitchen Over the next 90 days, patient will achieve adherence to monitoring guidelines and medication adherence to achieve therapeutic efficacy and achieve control of diabetes,  hypertension, and hyperlipidemia as evidenced by A1c goal <7% and BG goal <130/80, LDL goal <100 through collaboration with PharmD and provider.   Interventions: . 1:1 collaboration with [name], MD regarding development and update of comprehensive plan of care as evidenced by provider attestation and co-signature . Inter-disciplinary care team collaboration (see longitudinal plan of care) . 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  Medication Management: . Reports [facility] is supposed to be filling his medications today and shipping out. He cannot remember exactly if all of his medications will be included in this first shipment, but believes most will. Advised to call with any  questions or concerns when he receives his first adherence package  Diabetes: . Uncontrolled; current treatment: Ozempic 1 mg weekly, Jardiance 25 mg daily o Hx of metformin use - recently discontinued at last PCP visit on 4/6 due to feeling bad, fatigue, and no energy. Denied GI issues with metformin. Reports feeling better since discontinuation.  . Current glucose readings: fasting glucose: 98-130s, post prandial glucose: 100-130s; notes that he feels that his fasting sugars are often in [place] will not cover Libre-CGM as patient is not on insulin therapy . Current meal patterns: adding protein shakes for breakfast replacement  . 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 Reviewed goal A1c, goal fasting, goal 2 hour post prandial regimen.  . Discussed concept of Dawn Phenomenon contributing to higher fasting sugars. Denies any s/sx hypoglycemia overnight that would point to [facility] effect.  . Continue current regimen at this time. Advised  incorporation of more physical activity to target an eventual goal of 150 minutes of moderate intensity exercise weekly  Hypertension: . Controlled per home readings; current treatment: amlodipine/benazepril 10/40 mg QD, carvedilol 12.5 mg BID, HCTZ 25 mg daily . Current home readings: last home reading was 118/80; denies any concerns with lightheadedness, dizziness, headaches.  . Recommend to continue current regimen at this time . Recommended to continue current regimen  Hyperlipidemia: . Controlled per last lipid panel; current treatment: rosuvastatin 40 mg daily . Antiplatelet therapy: aspirin 81 mg daily . Recommended to continue current regimen  Erectile Dysfunction: . Controlled per patient report; current treatment: tadalafil 10 mg PRN, but denies needing recently . Recommended to continue current regimen  Supplementation: . Current treatment: cinnamon, vitamin B, multivitamin, started a Super Beet supplement . No identifiable drug interactions noted  Patient Goals/Self-Care Activities . Over the next 90 days, patient will:  - take medications as prescribed check glucose twice daily, document, and provide at future appointments check blood pressure daily, document, and provide at future appointments  Follow Up Plan: Telephone follow up appointment with care management team member scheduled for: ~12 weeks      Medication Assistance:  None required.  Patient affirms current coverage meets needs.  Follow Up:  Patient agrees to Care Plan and Follow-up.  Plan: Telephone follow up appointment with care management team member scheduled for:  ~ 12 weeks  [name] Darnelle Maffucci, PharmD, [place], [place] Clinical Pharmacist [employer] at [employer] [phone]

## 2020-10-19 NOTE — Patient Instructions (Signed)
Visit Information  Goals Addressed              This Visit's Progress     Patient Stated   .  Medication Management (pt-stated)        Patient Goals/Self-Care Activities . Over the next 90 days, patient will:  - take medications as prescribed check glucose BID, document, and provide at future appointments check blood pressure daily, document, and provide at future appointments       Patient verbalizes understanding of instructions provided today and agrees to view in MyChart.   Plan: Telephone follow up appointment with care management team member scheduled for:  ~ 12 weeks  [name] Feliz Beam, PharmD, [place], CPP Clinical Pharmacist [employer] at [employer] [phone]

## 2020-10-24 ENCOUNTER — Encounter: Payer: Self-pay | Admitting: Family Medicine

## 2020-10-25 MED ORDER — EMPAGLIFLOZIN 25 MG PO TABS: ORAL_TABLET | ORAL | 1 refills | Status: DC

## 2020-10-25 MED ORDER — AMLODIPINE BESY-BENAZEPRIL HCL 10-40 MG PO CAPS: 1.0000 | ORAL_CAPSULE | Freq: Every day | ORAL | 1 refills | Status: DC

## 2020-11-02 ENCOUNTER — Other Ambulatory Visit: Payer: Self-pay | Admitting: Family Medicine

## 2020-12-01 ENCOUNTER — Ambulatory Visit: Payer: BC Managed Care – PPO | Admitting: Family Medicine

## 2020-12-14 ENCOUNTER — Telehealth (INDEPENDENT_AMBULATORY_CARE_PROVIDER_SITE_OTHER): Payer: BC Managed Care – PPO | Admitting: Family

## 2020-12-14 ENCOUNTER — Encounter: Payer: Self-pay | Admitting: Family

## 2020-12-14 VITALS — BP 123/79 | Ht 72.99 in | Wt 196.0 lb

## 2020-12-14 DIAGNOSIS — B9689 Other specified bacterial agents as the cause of diseases classified elsewhere: Secondary | ICD-10-CM

## 2020-12-14 DIAGNOSIS — J019 Acute sinusitis, unspecified: Secondary | ICD-10-CM | POA: Diagnosis not present

## 2020-12-14 MED ORDER — AMOXICILLIN 500 MG PO CAPS: 500.0000 mg | ORAL_CAPSULE | Freq: Three times a day (TID) | ORAL | 0 refills | Status: AC

## 2020-12-14 MED ORDER — LEVOCETIRIZINE DIHYDROCHLORIDE 5 MG PO TABS: 5.0000 mg | ORAL_TABLET | Freq: Every evening | ORAL | 1 refills | Status: DC

## 2020-12-14 NOTE — Progress Notes (Signed)
Virtual Visit via Telephone Note  I connected with Chad Frye on 12/14/20 at  2:00 PM EDT by telephone and verified that I am speaking with the correct person using two identifiers.  Location: Patient: Home Provider: [employer]    I discussed the limitations, risks, security and privacy concerns of performing an evaluation and management service by telephone and the availability of in person appointments. I also discussed with the patient that there may be a patient responsible charge related to this service. The patient expressed understanding and agreed to proceed.   History of Present Illness: 60 year old male presents with concerns of nasal congestion, sinus pressure that is worse at night. Wakes up with a sore throat in the mornings. He has taken a rapid covid test that has been negative. Taking OTC medication without much relief.     Observations/Objective:A&O, NAD, no respiratory distress or SOB   Assessment and Plan:Chad Frye was seen today for sinus problem.  Diagnoses and all orders for this visit:  Acute bacterial sinusitis  Other orders -     levocetirizine (XYZAL) 5 MG tablet; Take 1 tablet (5 mg total) by mouth every evening. -     amoxicillin (AMOXIL) 500 MG capsule; Take 1 capsule (500 mg total) by mouth 3 (three) times daily for 7 days.     Follow Up Instructions:Call the office if symptoms worsen or persist. Recheck as scheduled and as needed.     I discussed the assessment and treatment plan with the patient. The patient was provided an opportunity to ask questions and all were answered. The patient agreed with the plan and demonstrated an understanding of the instructions.   The patient was advised to call back or seek an in-person evaluation if the symptoms worsen or if the condition fails to improve as anticipated.  I provided 20 minutes of non-face-to-face time during this encounter.   [name], FNP

## 2020-12-15 ENCOUNTER — Encounter: Payer: Self-pay | Admitting: Family

## 2020-12-19 ENCOUNTER — Other Ambulatory Visit: Payer: Self-pay | Admitting: Family Medicine

## 2020-12-19 DIAGNOSIS — E785 Hyperlipidemia, unspecified: Secondary | ICD-10-CM

## 2020-12-20 ENCOUNTER — Ambulatory Visit: Payer: BC Managed Care – PPO | Admitting: Family Medicine

## 2021-01-11 ENCOUNTER — Ambulatory Visit: Payer: BC Managed Care – PPO | Admitting: Pharmacist

## 2021-01-11 ENCOUNTER — Other Ambulatory Visit: Payer: Self-pay | Admitting: Family Medicine

## 2021-01-11 DIAGNOSIS — E119 Type 2 diabetes mellitus without complications: Secondary | ICD-10-CM

## 2021-01-11 DIAGNOSIS — I10 Essential (primary) hypertension: Secondary | ICD-10-CM

## 2021-01-11 DIAGNOSIS — E785 Hyperlipidemia, unspecified: Secondary | ICD-10-CM

## 2021-01-11 MED ORDER — LEVOCETIRIZINE DIHYDROCHLORIDE 5 MG PO TABS: 5.0000 mg | ORAL_TABLET | Freq: Every evening | ORAL | 3 refills | Status: DC

## 2021-01-11 MED ORDER — EMPAGLIFLOZIN 25 MG PO TABS: 25.0000 mg | ORAL_TABLET | Freq: Every day | ORAL | 3 refills | Status: DC

## 2021-01-11 NOTE — Patient Instructions (Signed)
Visit Information   Goals Addressed               This Visit's Progress     Patient Stated     Medication Management (pt-stated)        Patient Goals/Self-Care Activities Over the next 90 days, patient will:  - take medications as prescribed check glucose twice daily, document, and provide at future appointments check blood pressure daily, document, and provide at future appointments         Patient verbalizes understanding of instructions provided today and agrees to view in MyChart.   Plan: Telephone follow up appointment with care management team member scheduled for:  ~ 12 weeks  [name] Feliz Beam, PharmD, [place], CPP Clinical Pharmacist [employer] at [employer] [phone]

## 2021-01-11 NOTE — Chronic Care Management (AMB) (Signed)
Care Management   Pharmacy Note  01/11/2021 Name: Chad Frye MRN: [id number] DOB: 09-27-1960  Subjective: Chad Frye is a 60 y.o. year old male who is a primary care patient of [name], [name], MD. The Care Management team was consulted for assistance with care management and care coordination needs.    Engaged with patient by telephone for follow up visit in response to provider referral for pharmacy case management and/or care coordination services.   The patient was given information about Care Management services today including:  Care Management services includes personalized support from designated clinical staff supervised by the patient's primary care provider, including individualized plan of care and coordination with other care providers. 24/7 contact phone numbers for assistance for urgent and routine care needs. The patient may stop case management services at any time by phone call to the office staff.  Patient agreed to services and consent obtained.  Assessment:  Review of patient status, including review of consultants reports, laboratory and other test data, was performed as part of comprehensive evaluation and provision of chronic care management services.   SDOH (Social Determinants of Health) assessments and interventions performed:  SDOH Interventions    Flowsheet Row Most Recent Value  SDOH Interventions   Financial Strain Interventions Intervention Not Indicated        Objective:  Lab Results  Component Value Date   CREATININE 0.85 08/25/2020   CREATININE 0.85 01/16/2020   CREATININE 0.81 11/19/2019    Lab Results  Component Value Date   HGBA1C 7.2 (H) 08/25/2020       Component Value Date/Time   CHOL 138 01/16/2020 0907   TRIG 163.0 (H) 01/16/2020 0907   HDL 33.10 (L) 01/16/2020 0907   CHOLHDL 4 01/16/2020 0907   VLDL 32.6 01/16/2020 0907   LDLCALC 72 01/16/2020 0907   LDLCALC 113 (H) 07/27/2017 1601   LDLDIRECT 171.0 11/26/2018  0837    Clinical ASCVD: No  The 10-year ASCVD risk score [name], et al., 2013) is: 23.1%   Values used to calculate the score:     Age: 29 years     Sex: Male     Is Non-Hispanic African American: No     Diabetic: Yes     Tobacco smoker: Yes     Systolic Blood Pressure: 096 mmHg     Is BP treated: Yes     HDL Cholesterol: 33.1 mg/dL     Total Cholesterol: 138 mg/dL     BP Readings from Last 3 Encounters:  12/14/20 123/79  08/25/20 121/80  11/19/19 130/70    Care Plan  No Known Allergies  Medications Reviewed Today     Reviewed by [name], RPH-CPP (Pharmacist) on 01/11/21 at 74  Med List Status: <None>   Medication Order Taking? Sig Documenting Provider Last Dose Status Informant  ACCU-CHEK GUIDE test strip [id number] Yes USE AS DIRECTED 4 TIMES A DAY [name], MD Taking Active   Accu-Chek Softclix Lancets lancets [id number] Yes SMARTSIG:Topical [provider] Taking Active   amLODipine-benazepril (LOTREL) 10-40 MG capsule [id number] Yes Take 1 capsule by mouth daily. [name], MD Taking Active   aspirin 81 MG tablet [id number] Yes Take 1 tablet by mouth daily. [provider] Taking Active            Med Note [name]   Fri Oct 27, 2016  3:46 PM)    blood glucose meter kit and supplies KIT [id number] Yes Dispense  based on patient and insurance preference. Use up to four times daily as directed. (FOR ICD-9 250.00, 250.01). [name], MD Taking Active   carvedilol (COREG) 12.5 MG tablet [id number] Yes TAKE 1 TABLET (12.5 MG TOTAL) BY MOUTH 2 (TWO) TIMES DAILY WITH A MEAL. [name], MD Taking Active   hydrochlorothiazide (HYDRODIURIL) 25 MG tablet [id number] Yes TAKE 1 TABLET BY MOUTH EVERY DAY [name], MD Taking Active   JARDIANCE 25 MG TABS tablet [id number] Yes TAKE 1 TABLET (25 MG TOTAL) BY MOUTH DAILY BEFORE BREAKFAST. [name], MD Taking Active   levocetirizine (XYZAL) 5 MG  tablet [id number] Yes Take 1 tablet (5 mg total) by mouth every evening. Dutch Quint B, FNP Taking Active   MULTIPLE VITAMIN PO [id number] Yes Take 1 tablet by mouth daily. [provider] Taking Active            Med Note [name]   Fri Oct 27, 2016  3:57 PM)    rosuvastatin (CRESTOR) 40 MG tablet [id number] Yes TAKE 1 TABLET BY MOUTH EVERY DAY [name], MD Taking Active   Semaglutide, 1 MG/DOSE, (OZEMPIC, 1 MG/DOSE,) 2 MG/1.5ML SOPN [id number] Yes Inject 1 mg into the skin once a week. [name], MD Taking Active   tadalafil (CIALIS) 10 MG tablet [id number] Yes Please specify directions, refills and quantity [name], MD Taking Active   vitamin B-12 (CYANOCOBALAMIN) 1000 MCG tablet [id number] Yes Take 1,000 mcg by mouth daily. [provider] Taking Active             Patient Active Problem List   Diagnosis Date Noted   Nausea 11/19/2019   Nicotine dependence, cigarettes, uncomplicated 92/03/9416   Colon cancer screening 06/09/2019   Tiredness 03/05/2019   Microalbuminuria 01/14/2018   Diabetes mellitus type 2, uncomplicated ([place]) 40/81/4481   ED (erectile dysfunction) of organic origin 02/15/2015   Essential hypertension 02/15/2015   Hyperlipidemia 02/15/2015    Conditions to be addressed/monitored: HTN, HLD, and DMII  Care Plan : Medication Management  Updates made by [name], RPH-CPP since 01/11/2021 12:00 AM     Problem: T2DM, HTN, HLD, ED   Priority: High  Onset Date: 09/13/2020     Long-Range Goal: Disease Progression Prevention   Start Date: 09/13/2020  This Visit's Progress: On track  Recent Progress: On track  Priority: High  Note:   Current Barriers:  Unable to achieve control of chronic diseases (diabetes, hypertension, hyperlipidemia)  Pharmacist Clinical Goal(s):  Over the next 90 days, patient will achieve adherence to monitoring guidelines and medication adherence to achieve therapeutic  efficacy and achieve control of diabetes, hypertension, and hyperlipidemia as evidenced by A1c goal <7% and BG goal <130/80, LDL goal <100 through collaboration with PharmD and provider.   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 Mornings best time for phone appointments due to projected traffic volume .  Medication Management: All medications [facility]. Will resend refills on Jardiance to [facility], will collaborate w/ PCP to send refill on levocetirizine  Diabetes: Uncontrolled per last A1c but improved per BG readings; current treatment: Ozempic 1 mg weekly, Jardiance 25 mg daily Hx of metformin use -  recently discontinued at last PCP visit on 4/6 due to feeling bad, fatigue, and no energy. Denied GI issues with metformin. Reports feeling better since discontinuation.  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 F/u for A1c at upcoming PCP visit  Hypertension: Controlled per home readings; current treatment: amlodipine/benazepril 10/40 mg QD, carvedilol 12.5 mg BID, HCTZ 25 mg daily Fill history up to date  Current home readings: 110-120s/70-80s Denies any concerns with lightheadedness, dizziness to indicate hypotension Recommend to continue current regimen at this time.   Hyperlipidemia: Controlled per last lipid panel; current treatment: rosuvastatin 40 mg daily Antiplatelet therapy: aspirin 81 mg daily Recommended to continue current regimen  Erectile Dysfunction: Controlled per patient report; current treatment: tadalafil 10 mg PRN, but denies needing recently Recommended to continue current regimen  Allergies: Reports issues with sneezing, runny nose periodically, sinus congestion; current regimen: levocetirizine 10 mg daily, loratadine 10 mg daily - reports that since being prescribed levocetirizine by Dutch Quint at recent acute visit, his nasal allergies have been much better controlled.  Reviewed therapeutic duplication of levocetirizine and loratadine. Discussed use instead of nasal steroid or nasal antihistamine. Patient declines use of nasal sprays. Discussed use of nasal saline as a rinse when congestion is worse. Advised to try stopping OTC loratadine to see if levocetirizine monotherapy controls symptoms. If not, consider addition of montelukast  Requests that a long term script for levocetirizine be sent to [facility] to be included in adherence packing. Will collaborate w/ PCP.  Supplementation: Current treatment: Vitamin B12, multivitamin,  No identifiable drug interactions noted  Patient Goals/Self-Care Activities Over the next 90 days, patient will:  - take medications as prescribed check glucose twice daily, document, and provide at future appointments check blood pressure daily, document, and provide at future appointments  Follow Up Plan:  Telephone follow up appointment with care management team member scheduled for: ~12 weeks      Medication Assistance:  None required.  Patient affirms current coverage meets needs.  Follow Up:  Patient agrees to Care Plan and Follow-up.  Plan: Telephone follow up appointment with care management team member scheduled for:  ~ 12 weeks  [name] Darnelle Maffucci, PharmD, [place], [place] Clinical Pharmacist [employer] [facility] [employer] [phone]

## 2021-01-25 ENCOUNTER — Encounter: Payer: Self-pay | Admitting: Family Medicine

## 2021-01-25 ENCOUNTER — Ambulatory Visit: Payer: BC Managed Care – PPO | Admitting: Family Medicine

## 2021-01-25 ENCOUNTER — Other Ambulatory Visit: Payer: Self-pay

## 2021-01-25 VITALS — BP 110/70 | HR 72 | Temp 98.7°F | Ht 73.0 in | Wt 191.2 lb

## 2021-01-25 DIAGNOSIS — I10 Essential (primary) hypertension: Secondary | ICD-10-CM | POA: Diagnosis not present

## 2021-01-25 DIAGNOSIS — R809 Proteinuria, unspecified: Secondary | ICD-10-CM | POA: Diagnosis not present

## 2021-01-25 DIAGNOSIS — N529 Male erectile dysfunction, unspecified: Secondary | ICD-10-CM

## 2021-01-25 DIAGNOSIS — E785 Hyperlipidemia, unspecified: Secondary | ICD-10-CM

## 2021-01-25 DIAGNOSIS — E119 Type 2 diabetes mellitus without complications: Secondary | ICD-10-CM | POA: Diagnosis not present

## 2021-01-25 DIAGNOSIS — R5383 Other fatigue: Secondary | ICD-10-CM

## 2021-01-25 DIAGNOSIS — B351 Tinea unguium: Secondary | ICD-10-CM

## 2021-01-25 DIAGNOSIS — J309 Allergic rhinitis, unspecified: Secondary | ICD-10-CM | POA: Insufficient documentation

## 2021-01-25 LAB — COMPREHENSIVE METABOLIC PANEL
ALT: 21 U/L (ref 0–53)
AST: 17 U/L (ref 0–37)
Albumin: 4.2 g/dL (ref 3.5–5.2)
Alkaline Phosphatase: 85 U/L (ref 39–117)
BUN: 12 mg/dL (ref 6–23)
CO2: 26 mEq/L (ref 19–32)
Calcium: 9.4 mg/dL (ref 8.4–10.5)
Chloride: 101 mEq/L (ref 96–112)
Creatinine, Ser: 0.77 mg/dL (ref 0.40–1.50)
GFR: 98.37 mL/min (ref >60.00)
Glucose, Bld: 98 mg/dL (ref 70–99)
Potassium: 4.4 mEq/L (ref 3.5–5.1)
Sodium: 135 mEq/L (ref 135–145)
Total Bilirubin: 0.6 mg/dL (ref 0.2–1.2)
Total Protein: 6.7 g/dL (ref 6.0–8.3)

## 2021-01-25 LAB — LIPID PANEL
Cholesterol: 115 mg/dL (ref 0–200)
HDL: 30.4 mg/dL — ABNORMAL LOW (ref >39.00)
LDL Cholesterol: 62 mg/dL (ref 0–99)
NonHDL: 84.46
Total CHOL/HDL Ratio: 4
Triglycerides: 111 mg/dL (ref 0.0–149.0)
VLDL: 22.2 mg/dL (ref 0.0–40.0)

## 2021-01-25 LAB — HEMOGLOBIN A1C: Hgb A1c MFr Bld: 7.3 % — ABNORMAL HIGH (ref 4.6–6.5)

## 2021-01-25 LAB — TESTOSTERONE: Testosterone: 65.64 ng/dL — ABNORMAL LOW (ref 300.00–890.00)

## 2021-01-25 LAB — MICROALBUMIN / CREATININE URINE RATIO
Creatinine,U: 77.8 mg/dL
Microalb Creat Ratio: 0.9 mg/g (ref 0.0–30.0)
Microalb, Ur: <0.7 mg/dL (ref 0.0–1.9)

## 2021-01-25 NOTE — Assessment & Plan Note (Signed)
Undetermined control.  He will continue Jardiance 25 mg daily and Ozempic 1 mg weekly.  A1c today.  Urine microalbumin as well.

## 2021-01-25 NOTE — Assessment & Plan Note (Signed)
Check lipid panel.  Continue Crestor 40 mg once daily. 

## 2021-01-25 NOTE — Progress Notes (Signed)
Tommi Rumps, MD Phone: [phone]  Chad Frye is a 60 y.o. male who presents today for f/u.  DIABETES Disease Monitoring: Blood Sugar ranges-not checking consistently Polyuria/phagia/dipsia- no      Optho- UTD Medications: Compliance- taking jardiance, ozempic Hypoglycemic symptoms- no  HYPERTENSION Disease Monitoring Home BP Monitoring 118/74 Chest pain- no    Dyspnea- no Medications Compliance-  taking coreg, lotrel, HCTZ.  Edema- no BMET    Component Value Date/Time   NA 134 (L) 08/25/2020 0823   K 4.2 08/25/2020 0823   CL 98 08/25/2020 0823   CO2 25 08/25/2020 0823   GLUCOSE 123 (H) 08/25/2020 0823   BUN 19 08/25/2020 0823   BUN 12 02/13/2014 0000   CREATININE 0.85 08/25/2020 0823   CREATININE 0.92 02/15/2018 1554   CALCIUM 9.7 08/25/2020 0823   Fatigue: Patient notes this did improve with stopping metformin.  He does still have some mild fatigue.  He notes no depression.  He notes some stress with having his kitchen remodel though no significant anxiety.  He does report some trouble falling asleep over the last several weeks.  He does report low sex drive for the last year.  He wonders if his testosterone could be an issue.  Erectile dysfunction: Patient notes he is not able to maintain erection and he does note his erections are less strong.  No discomfort.  Cialis was beneficial for this.  Onychomycosis: Patient notes toenail changes over the last year.  Allergic rhinitis: Patient reports prior rhinorrhea, sneezing, and watery eyes.  He was switched over to Xyzal and notes that has been very beneficial.  Social History   Tobacco Use  Smoking Status Every Day   Types: Cigarettes   Last attempt to quit: 11/28/2015   Years since quitting: 5.1  Smokeless Tobacco Never    Current Outpatient Medications on File Prior to Visit  Medication Sig Dispense Refill   ACCU-CHEK GUIDE test strip USE AS DIRECTED 4 TIMES A DAY 100 strip 1   Accu-Chek Softclix Lancets  lancets SMARTSIG:Topical     amLODipine-benazepril (LOTREL) 10-40 MG capsule Take 1 capsule by mouth daily. 90 capsule 1   aspirin 81 MG tablet Take 1 tablet by mouth daily.     blood glucose meter kit and supplies KIT Dispense based on patient and insurance preference. Use up to four times daily as directed. (FOR ICD-9 250.00, 250.01). 1 each 0   carvedilol (COREG) 12.5 MG tablet TAKE 1 TABLET (12.5 MG TOTAL) BY MOUTH 2 (TWO) TIMES DAILY WITH A MEAL. 60 tablet 5   empagliflozin (JARDIANCE) 25 MG TABS tablet Take 1 tablet (25 mg total) by mouth daily. 90 tablet 3   hydrochlorothiazide (HYDRODIURIL) 25 MG tablet TAKE 1 TABLET BY MOUTH EVERY DAY 30 tablet 5   levocetirizine (XYZAL) 5 MG tablet Take 1 tablet (5 mg total) by mouth every evening. 90 tablet 3   LORATADINE PO Take 1 tablet by mouth daily.     MULTIPLE VITAMIN PO Take 1 tablet by mouth daily.     rosuvastatin (CRESTOR) 40 MG tablet TAKE 1 TABLET BY MOUTH EVERY DAY 30 tablet 5   Semaglutide, 1 MG/DOSE, (OZEMPIC, 1 MG/DOSE,) 2 MG/1.5ML SOPN Inject 1 mg into the skin once a week. 9 mL 1   tadalafil (CIALIS) 10 MG tablet Please specify directions, refills and quantity 10 tablet 0   vitamin B-12 (CYANOCOBALAMIN) 1000 MCG tablet Take 1,000 mcg by mouth daily.     No current facility-administered medications on file  prior to visit.     ROS see history of present illness  Objective  Physical Exam Vitals:   01/25/21 0939  BP: 110/70  Pulse: 72  Temp: 98.7 F (37.1 C)  SpO2: 97%    BP Readings from Last 3 Encounters:  01/25/21 110/70  12/14/20 123/79  08/25/20 121/80   Wt Readings from Last 3 Encounters:  01/25/21 191 lb 3.2 oz (86.7 kg)  12/14/20 196 lb (88.9 kg)  08/25/20 195 lb 9.6 oz (88.7 kg)    Physical Exam Constitutional:      General: He is not in acute distress.    Appearance: He is not diaphoretic.  Cardiovascular:     Rate and Rhythm: Normal rate and regular rhythm.     Heart sounds: Normal heart sounds.   Pulmonary:     Effort: Pulmonary effort is normal.     Breath sounds: Normal breath sounds.  Musculoskeletal:     Right lower leg: No edema.     Left lower leg: No edema.  Skin:    General: Skin is warm and dry.  Neurological:     Mental Status: He is alert.     Assessment/Plan: Please see individual problem list.  Problem List Items Addressed This Visit     Allergic rhinitis    Much improved with Xyzal.  He can continue Xyzal 5 mg daily.      Diabetes mellitus type 2, uncomplicated ([facility])    Undetermined control.  He will continue Jardiance 25 mg daily and Ozempic 1 mg weekly.  A1c today.  Urine microalbumin as well.      Relevant Orders   HgB A1c   Urine Microalbumin w/creat. ratio   ED (erectile dysfunction) of organic origin    Discussed he could continue Cialis daily as needed.      Essential hypertension - Primary    Adequately controlled.  He will continue carvedilol 12.5 mg twice daily, HCTZ 25 mg daily, and Lotrel 1 tablet daily.  Check CMP.      Relevant Orders   Comp Met (CMET)   Urine Microalbumin w/creat. ratio   Hyperlipidemia    Check lipid panel.  Continue Crestor 40 mg once daily.      Relevant Orders   Lipid panel   Comp Met (CMET)   Microalbuminuria    Check urine microalbumin.      Relevant Orders   Urine Microalbumin w/creat. ratio   Onychomycosis    Discussed oral and topical treatments.  Patient declines both of these.      Tiredness    Improved off of metformin though he does still have some symptoms.  We will check testosterone level.      Relevant Orders   Testosterone     Health Maintenance: The patient declines flu vaccine and pneumonia vaccine today.  Return in about 3 months (around 04/26/2021) for Diabetes.  This visit occurred during the SARS-CoV-2 public health emergency.  Safety protocols were in place, including screening questions prior to the visit, additional usage of staff PPE, and extensive cleaning of exam  room while observing appropriate contact time as indicated for disinfecting solutions.    Tommi Rumps, MD [place]

## 2021-01-25 NOTE — Assessment & Plan Note (Signed)
Check urine microalbumin. ?

## 2021-01-25 NOTE — Assessment & Plan Note (Signed)
Improved off of metformin though he does still have some symptoms.  We will check testosterone level.

## 2021-01-25 NOTE — Assessment & Plan Note (Signed)
Adequately controlled.  He will continue carvedilol 12.5 mg twice daily, HCTZ 25 mg daily, and Lotrel 1 tablet daily.  Check CMP.

## 2021-01-25 NOTE — Assessment & Plan Note (Addendum)
Discussed he could continue Cialis daily as needed.

## 2021-01-25 NOTE — Assessment & Plan Note (Signed)
Discussed oral and topical treatments.  Patient declines both of these.

## 2021-01-25 NOTE — Patient Instructions (Signed)
Nice to see you. We will get lab work today.  

## 2021-01-25 NOTE — Assessment & Plan Note (Signed)
Much improved with Xyzal.  He can continue Xyzal 5 mg daily.

## 2021-01-26 ENCOUNTER — Encounter: Payer: Self-pay | Admitting: Family Medicine

## 2021-01-26 DIAGNOSIS — R7989 Other specified abnormal findings of blood chemistry: Secondary | ICD-10-CM

## 2021-01-31 ENCOUNTER — Telehealth: Payer: Self-pay | Admitting: Family Medicine

## 2021-01-31 NOTE — Telephone Encounter (Signed)
Patient is returning your call from earlier,please advise. 

## 2021-02-03 ENCOUNTER — Other Ambulatory Visit: Payer: Self-pay

## 2021-02-03 ENCOUNTER — Other Ambulatory Visit (INDEPENDENT_AMBULATORY_CARE_PROVIDER_SITE_OTHER): Payer: BC Managed Care – PPO

## 2021-02-03 DIAGNOSIS — R7989 Other specified abnormal findings of blood chemistry: Secondary | ICD-10-CM | POA: Diagnosis not present

## 2021-02-03 LAB — TESTOSTERONE: Testosterone: 158.22 ng/dL — ABNORMAL LOW (ref 300.00–890.00)

## 2021-02-07 ENCOUNTER — Other Ambulatory Visit: Payer: Self-pay | Admitting: Family Medicine

## 2021-02-07 DIAGNOSIS — E119 Type 2 diabetes mellitus without complications: Secondary | ICD-10-CM

## 2021-02-07 MED ORDER — SEMAGLUTIDE (2 MG/DOSE) 8 MG/3ML ~~LOC~~ SOPN: 2.0000 mg | PEN_INJECTOR | SUBCUTANEOUS | 3 refills | Status: DC

## 2021-02-10 ENCOUNTER — Other Ambulatory Visit: Payer: Self-pay | Admitting: Family

## 2021-02-15 ENCOUNTER — Telehealth: Payer: Self-pay | Admitting: Family Medicine

## 2021-02-15 DIAGNOSIS — E119 Type 2 diabetes mellitus without complications: Secondary | ICD-10-CM

## 2021-02-15 DIAGNOSIS — R7989 Other specified abnormal findings of blood chemistry: Secondary | ICD-10-CM

## 2021-02-15 MED ORDER — LEVOCETIRIZINE DIHYDROCHLORIDE 5 MG PO TABS: 5.0000 mg | ORAL_TABLET | Freq: Every evening | ORAL | 3 refills | Status: DC

## 2021-02-15 NOTE — Telephone Encounter (Signed)
Patient calling in and needed the generic version of Xyzal. [facility] [place]   States that he also needs the 2 mg [facility]em[facility] as they state they do not have the script.   Patient also states he was supposed to be referred to Endo but there was none placed.   Please advise

## 2021-02-15 NOTE — Telephone Encounter (Signed)
I [facility] Can you confirm that is where he wants this to go before I send it again? I placed the endocrinology referral. I am not sure that I ever got that result note back. [facility].

## 2021-02-15 NOTE — Telephone Encounter (Signed)
Patient calling in and needed the generic version of Xyzal. States [facility] [place].    States that he also needs the 2 mg [facility]em[facility] as [facility] state they do not have the script.    Patient also states he was supposed to be referred to Endo but there was none placed.    Please advise   Shantell Belongia,cma

## 2021-02-15 NOTE — Addendum Note (Signed)
Addended by: Glori Luis on: 02/15/2021 07:41 PM   Modules accepted: Orders

## 2021-02-24 ENCOUNTER — Encounter: Payer: Self-pay | Admitting: Family Medicine

## 2021-03-01 NOTE — Telephone Encounter (Signed)
This patient has not heard from [facility] endocrinology yet. Do you know if they are backed up on their referrals?  Chad Frye is calling to check on the status of that referral.  Mazi Schuff,cma

## 2021-03-03 DIAGNOSIS — E119 Type 2 diabetes mellitus without complications: Secondary | ICD-10-CM | POA: Diagnosis not present

## 2021-03-03 LAB — HM DIABETES EYE EXAM

## 2021-04-11 ENCOUNTER — Telehealth: Payer: BC Managed Care – PPO

## 2021-04-23 ENCOUNTER — Other Ambulatory Visit: Payer: Self-pay | Admitting: Family Medicine

## 2021-04-23 DIAGNOSIS — E119 Type 2 diabetes mellitus without complications: Secondary | ICD-10-CM

## 2021-05-02 ENCOUNTER — Other Ambulatory Visit: Payer: Self-pay

## 2021-05-02 ENCOUNTER — Ambulatory Visit: Payer: BC Managed Care – PPO | Admitting: Family Medicine

## 2021-05-02 ENCOUNTER — Encounter: Payer: Self-pay | Admitting: Family Medicine

## 2021-05-02 VITALS — BP 110/70 | HR 69 | Temp 98.1°F | Ht 73.0 in | Wt 187.2 lb

## 2021-05-02 DIAGNOSIS — E119 Type 2 diabetes mellitus without complications: Secondary | ICD-10-CM | POA: Diagnosis not present

## 2021-05-02 DIAGNOSIS — I10 Essential (primary) hypertension: Secondary | ICD-10-CM

## 2021-05-02 DIAGNOSIS — E291 Testicular hypofunction: Secondary | ICD-10-CM | POA: Insufficient documentation

## 2021-05-02 LAB — LUTEINIZING HORMONE: LH: 1.31 m[IU]/mL — ABNORMAL LOW (ref 1.50–9.30)

## 2021-05-02 LAB — FOLLICLE STIMULATING HORMONE: FSH: 1.7 m[IU]/mL (ref 1.4–18.1)

## 2021-05-02 LAB — TSH: TSH: 1.36 u[IU]/mL (ref 0.35–5.50)

## 2021-05-02 LAB — HEMOGLOBIN A1C: Hgb A1c MFr Bld: 7.3 % — ABNORMAL HIGH (ref 4.6–6.5)

## 2021-05-02 NOTE — Progress Notes (Signed)
Chad Rumps, MD Phone: [phone]  Chad Frye is a 60 y.o. male who presents today for f/u.  HYPERTENSION Disease Monitoring Home BP Monitoring low 601V systolic Chest pain- no    Dyspnea- no Medications Compliance-  taking amlodipine, benazepril, HCTZ, coreg.   Edema- no BMET    Component Value Date/Time   NA 135 01/25/2021 0957   K 4.4 01/25/2021 0957   CL 101 01/25/2021 0957   CO2 26 01/25/2021 0957   GLUCOSE 98 01/25/2021 0957   BUN 12 01/25/2021 0957   BUN 12 02/13/2014 0000   CREATININE 0.77 01/25/2021 0957   CREATININE 0.92 02/15/2018 1554   CALCIUM 9.4 01/25/2021 0957   DIABETES Disease Monitoring: Blood Sugar ranges-low 100s  Polyuria/phagia/dipsia- no      Optho- UTD Medications: Compliance- taking jardiance, ozempic Hypoglycemic symptoms- no Patient reports some dietary changes with the eating less junk food.  He is eating a small amount in the morning.  Occasionally he has lunch though is typically not hungry in the middle of the day.  He typically has a bigger meal in the evening.  Previously he was eating 2 hotdogs or a burrito between meals during the day.  Hypogonadism: Patient has an endocrinology appointment on March 15.  Social History   Tobacco Use  Smoking Status Every Day   Types: Cigarettes   Last attempt to quit: 11/28/2015   Years since quitting: 5.4  Smokeless Tobacco Never    Current Outpatient Medications on File Prior to Visit  Medication Sig Dispense Refill   ACCU-CHEK GUIDE test strip USE AS DIRECTED 4 TIMES A DAY 100 strip 1   Accu-Chek Softclix Lancets lancets SMARTSIG:Topical     amLODipine-benazepril (LOTREL) 10-40 MG capsule TAKE 1 CAPSULE BY MOUTH EVERY DAY 30 capsule 5   aspirin 81 MG tablet Take 1 tablet by mouth daily.     blood glucose meter kit and supplies KIT Dispense based on patient and insurance preference. Use up to four times daily as directed. (FOR ICD-9 250.00, 250.01). 1 each 0   carvedilol (COREG) 12.5 MG  tablet TAKE 1 TABLET (12.5 MG TOTAL) BY MOUTH 2 (TWO) TIMES DAILY WITH A MEAL. 60 tablet 5   hydrochlorothiazide (HYDRODIURIL) 25 MG tablet TAKE 1 TABLET BY MOUTH EVERY DAY 30 tablet 5   JARDIANCE 25 MG TABS tablet TAKE 1 TABLET BY MOUTH DAILY BEFORE BREAKFAST. 30 tablet 5   levocetirizine (XYZAL) 5 MG tablet Take 1 tablet (5 mg total) by mouth every evening. 30 tablet 3   MULTIPLE VITAMIN PO Take 1 tablet by mouth daily.     rosuvastatin (CRESTOR) 40 MG tablet TAKE 1 TABLET BY MOUTH EVERY DAY 30 tablet 5   Semaglutide, 2 MG/DOSE, 8 MG/3ML SOPN Inject 2 mg as directed once a week. 9 mL 3   tadalafil (CIALIS) 10 MG tablet Please specify directions, refills and quantity 10 tablet 0   vitamin B-12 (CYANOCOBALAMIN) 1000 MCG tablet Take 1,000 mcg by mouth daily.     No current facility-administered medications on file prior to visit.     ROS see history of present illness  Objective  Physical Exam Vitals:   05/02/21 0816  BP: 110/70  Pulse: 69  Temp: 98.1 F (36.7 C)  SpO2: 98%    BP Readings from Last 3 Encounters:  05/02/21 110/70  01/25/21 110/70  12/14/20 123/79   Wt Readings from Last 3 Encounters:  05/02/21 187 lb 3.2 oz (84.9 kg)  01/25/21 191 lb 3.2 oz (86.7  kg)  12/14/20 196 lb (88.9 kg)    Physical Exam Constitutional:      General: He is not in acute distress.    Appearance: He is not diaphoretic.  Cardiovascular:     Rate and Rhythm: Normal rate and regular rhythm.     Heart sounds: Normal heart sounds.  Pulmonary:     Effort: Pulmonary effort is normal.     Breath sounds: Normal breath sounds.  Skin:    General: Skin is warm and dry.  Neurological:     Mental Status: He is alert.     Assessment/Plan: Please see individual problem list.  Problem List Items Addressed This Visit     Diabetes mellitus type 2, uncomplicated ([place])    Seems to be well controlled.  Check A1c.  He will continue Jardiance 25 mg once daily and Ozempic 2 mg weekly.  I suspect  his mild weight loss is related to dietary changes and the Ozempic.  We will continue to monitor that.      Relevant Orders   HgB A1c   Essential hypertension    Well-controlled.  He will continue amlodipine, benazepril, Coreg, and HCTZ.      Hypogonadism in male - Primary    We will start his initial work-up with an LH and FSH.  We will also check a TSH.  Discussed the potential for additional labs depending on his results.      Relevant Orders   TSH   [facility]   LH     Health Maintenance: The patient was encouraged to complete his Cologuard which she already has at home.  He was encouraged to get the updated COVID booster.  He declines a flu vaccine.  Return in about 3 months (around 07/31/2021) for diabetes.  This visit occurred during the SARS-CoV-2 public health emergency.  Safety protocols were in place, including screening questions prior to the visit, additional usage of staff PPE, and extensive cleaning of exam room while observing appropriate contact time as indicated for disinfecting solutions.    Chad Rumps, MD [place]

## 2021-05-02 NOTE — Assessment & Plan Note (Signed)
Seems to be well controlled.  Check A1c.  He will continue Jardiance 25 mg once daily and Ozempic 2 mg weekly.  I suspect his mild weight loss is related to dietary changes and the Ozempic.  We will continue to monitor that.

## 2021-05-02 NOTE — Assessment & Plan Note (Signed)
We will start his initial work-up with an LH and FSH.  We will also check a TSH.  Discussed the potential for additional labs depending on his results.

## 2021-05-02 NOTE — Patient Instructions (Addendum)
Nice to see you. We will get lab work today to start the work up for your low testosterone.  Please complete the cologuard.  I believe the mild weight loss you've experienced is related to your diet changes and the ozempic. We will continue to monitor your weight. If it drop precipitously moving forward please let me know.

## 2021-05-02 NOTE — Assessment & Plan Note (Signed)
Well-controlled.  He will continue amlodipine, benazepril, Coreg, and HCTZ.

## 2021-05-04 ENCOUNTER — Telehealth: Payer: Self-pay | Admitting: *Deleted

## 2021-05-04 DIAGNOSIS — E291 Testicular hypofunction: Secondary | ICD-10-CM

## 2021-05-04 NOTE — Telephone Encounter (Signed)
I called the patient and rescheduled his labs.  Chad Frye,cma

## 2021-05-04 NOTE — Telephone Encounter (Signed)
I apologize a should have been more clear in the result note.  He needs 8 AM labs.  I have placed orders.  Please get his lab work rescheduled for 8 AM on a day that works for him and our schedule.

## 2021-05-04 NOTE — Telephone Encounter (Signed)
Please place future orders for lab appt.  

## 2021-05-05 ENCOUNTER — Other Ambulatory Visit: Payer: BC Managed Care – PPO

## 2021-05-11 ENCOUNTER — Other Ambulatory Visit: Payer: Self-pay | Admitting: Family Medicine

## 2021-05-11 DIAGNOSIS — E291 Testicular hypofunction: Secondary | ICD-10-CM

## 2021-05-20 ENCOUNTER — Other Ambulatory Visit: Payer: Self-pay | Admitting: Family Medicine

## 2021-05-27 ENCOUNTER — Other Ambulatory Visit: Payer: Self-pay

## 2021-05-27 ENCOUNTER — Ambulatory Visit
Admission: RE | Admit: 2021-05-27 | Discharge: 2021-05-27 | Disposition: A | Discharge status: Home / Self Care | Payer: BC Managed Care – PPO | Source: Ambulatory Visit | Attending: Family Medicine | Admitting: Family Medicine

## 2021-05-27 DIAGNOSIS — E291 Testicular hypofunction: Secondary | ICD-10-CM | POA: Diagnosis not present

## 2021-05-27 DIAGNOSIS — I619 Nontraumatic intracerebral hemorrhage, unspecified: Secondary | ICD-10-CM | POA: Diagnosis not present

## 2021-06-03 ENCOUNTER — Telehealth: Payer: Self-pay | Admitting: Family Medicine

## 2021-06-03 NOTE — Telephone Encounter (Signed)
Patient called and is requesting another referral to Endocrinology. Endocrinology call patient today to let him know they had to cancel his March appointment and  they were not taking on any new patient's do to staffing.  Chad Frye,cma

## 2021-06-03 NOTE — Telephone Encounter (Signed)
Patient called and is requesting another referral to Endocrinology. Endocrinology call patient today to let him know they had to cancel his March appointment and  they were not taking on any new patient's do to staffing.

## 2021-06-12 ENCOUNTER — Other Ambulatory Visit: Payer: Self-pay | Admitting: Family

## 2021-06-12 DIAGNOSIS — E291 Testicular hypofunction: Secondary | ICD-10-CM

## 2021-06-12 NOTE — Addendum Note (Signed)
Addended by: Dutch Quint B on: 06/12/2021 12:47 PM   Modules accepted: Orders

## 2021-06-16 ENCOUNTER — Ambulatory Visit: Payer: BC Managed Care – PPO | Admitting: Pharmacist

## 2021-06-16 DIAGNOSIS — E119 Type 2 diabetes mellitus without complications: Secondary | ICD-10-CM

## 2021-06-16 DIAGNOSIS — I10 Essential (primary) hypertension: Secondary | ICD-10-CM

## 2021-06-16 DIAGNOSIS — E785 Hyperlipidemia, unspecified: Secondary | ICD-10-CM

## 2021-06-16 MED ORDER — FREESTYLE LIBRE 3 SENSOR MISC: 1.0000 | 11 refills | Status: DC

## 2021-06-16 NOTE — Chronic Care Management (AMB) (Signed)
Chronic Care Management CCM Pharmacy Note  06/16/2021 Name:  Chad Frye MRN:  [id number] DOB:  1960/12/09  Summary: - Some queasiness with full Ozempic 2 mg dose. Otherwise tolerating regimen  Recommendations/Changes made from today's visit: - Will give samples of CGM to see if interested in the long term - Consider pursuing insurance coverage of [place] moving forward vs re-trial of metformin   Subjective: Chad Frye is an 61 y.o. year old male who is a primary patient of [name], [name], MD.  The CCM team was consulted for assistance with disease management and care coordination needs.    Engaged with patient by telephone for follow up visit for pharmacy case management and/or care coordination services.   Objective:  Medications Reviewed Today     Reviewed by [name], RPH-CPP (Pharmacist) on 06/16/21 at 1408  Med List Status: <None>   Medication Order Taking? Sig Documenting Provider Last Dose Status Informant  ACCU-CHEK GUIDE test strip [id number]  USE AS DIRECTED 4 TIMES A DAY [name], MD  Active   Accu-Chek Softclix Lancets lancets [id number]  SMARTSIG:Topical [provider]  Active   amLODipine-benazepril (LOTREL) 10-40 MG capsule [id number] Yes TAKE 1 CAPSULE BY MOUTH EVERY DAY [name], MD Taking Active   aspirin 81 MG tablet [id number] Yes Take 1 tablet by mouth daily. [provider] Taking Active            Med Note [name]   Fri Oct 27, 2016  3:46 PM)    blood glucose meter kit and supplies KIT [id number]  Dispense based on patient and insurance preference. Use up to four times daily as directed. (FOR ICD-9 250.00, 250.01). [name], MD  Active   carvedilol (COREG) 12.5 MG tablet [id number] Yes TAKE 1 TABLET (12.5 MG TOTAL) BY MOUTH 2 (TWO) TIMES DAILY WITH A MEAL. [name], MD Taking Active   hydrochlorothiazide (HYDRODIURIL) 25 MG tablet [id number] Yes TAKE 1 TABLET BY MOUTH EVERY  DAY [name], MD Taking Active   JARDIANCE 25 MG TABS tablet [id number] Yes TAKE 1 TABLET BY MOUTH DAILY BEFORE BREAKFAST. [name], MD Taking Active   levocetirizine (XYZAL) 5 MG tablet [id number] Yes TAKE 1 TABLET BY MOUTH EVERY DAY IN THE Cleatis Polka, MD Taking Active   MULTIPLE VITAMIN PO [id number] Yes Take 1 tablet by mouth daily. [provider] Taking Active            Med Note [name]   Fri Oct 27, 2016  3:57 PM)    rosuvastatin (CRESTOR) 40 MG tablet [id number] Yes TAKE 1 TABLET BY MOUTH EVERY DAY [name], MD Taking Active   Semaglutide, 2 MG/DOSE, 8 MG/3ML SOPN [id number] Yes Inject 2 mg as directed once a week. [name], MD Taking Active   tadalafil (CIALIS) 10 MG tablet [id number] Yes Please specify directions, refills and quantity [name], MD Taking Active   vitamin B-12 (CYANOCOBALAMIN) 1000 MCG tablet [id number] Yes Take 1,000 mcg by mouth daily. [provider] Taking Active             Pertinent Labs:   Lab Results  Component Value Date   HGBA1C 7.3 (H) 05/02/2021   Lab Results  Component Value Date   CHOL 115 01/25/2021   HDL 30.40 (L) 01/25/2021   LDLCALC 62 01/25/2021   LDLDIRECT 171.0 11/26/2018   TRIG 111.0 01/25/2021   CHOLHDL 4  01/25/2021   Lab Results  Component Value Date   CREATININE 0.77 01/25/2021   BUN 12 01/25/2021   NA 135 01/25/2021   K 4.4 01/25/2021   CL 101 01/25/2021   CO2 26 01/25/2021    SDOH:  (Social Determinants of Health) assessments and interventions performed:  SDOH Interventions    Flowsheet Row Most Recent Value  SDOH Interventions   Financial Strain Interventions Intervention Not Indicated       CCM Care Plan  Review of patient past medical history, allergies, medications, health status, including review of consultants reports, laboratory and other test data, was performed as part of comprehensive evaluation and provision of  chronic care management services.   Care Plan : Medication Management  Updates made by [name], RPH-CPP since 06/16/2021 12:00 AM     Problem: T2DM, HTN, HLD, ED   Priority: High  Onset Date: 09/13/2020     Long-Range Goal: Disease Progression Prevention   Start Date: 09/13/2020  Recent Progress: On track  Priority: High  Note:   Current Barriers:  Unable to achieve control of chronic diseases (diabetes, hypertension, hyperlipidemia)  Pharmacist Clinical Goal(s):  Over the next 90 days, patient will achieve adherence to monitoring guidelines and medication adherence to achieve therapeutic efficacy and achieve control of diabetes, hypertension, and hyperlipidemia as evidenced by A1c goal <7% and BG goal <130/80, LDL goal <100 through collaboration with PharmD and provider.   Interventions: 1:1 collaboration with [name], MD regarding development and update of comprehensive plan of care as evidenced by provider attestation and co-signature Inter-disciplinary care team collaboration (see longitudinal plan of care) Comprehensive medication review performed; medication list updated in electronic medical record  Health Maintenance   Yearly diabetic eye exam: up to date Yearly diabetic foot exam: up to date Urine microalbumin: up to date Yearly influenza vaccination: due - declined Td/Tdap vaccination: up to date Pneumonia vaccination: up to date COVID vaccinations: due - declined Shingrix vaccinations: due - declined Colonoscopy: due  Diabetes: Uncontrolled per last A1c; current treatment: Ozempic 2 mg weekly, Jardiance 25 mg daily Reports he still has periodic queasiness the day after Ozempic dosing. He will be able to do 2 mg one week, but then feels he needs to cut back to 1 or ~1.5 mg the next week.  Hx of metformin - due to feeling bad, fatigue, and no energy. Denied GI issues with metformin. Reported eeling better since discontinuation.  Discussed  pursuing use of Libre 3 on his insurance to aid in evaluation of glucose. Patient amenable. Script sent to pharmacy. Copay is $74.99. Will provide sample when he is in office next week to try before paying.  Discussing pursuing insurance coverage for [facility], as he may received greater benefit at lower comparative doses and may tolerating differently from a GI perspective. Also discussed re-trial of metformin therapy. He elects to continue current regimen at this time and follow up with PCP at next appointment in March.   Hypertension: Controlled per home readings; current treatment: amlodipine/benazepril 10/40 mg QD, carvedilol 12.5 mg BID, HCTZ 25 mg daily Previously recommended to continue current regimen at this time.   Hyperlipidemia: Controlled per last lipid panel; current treatment: rosuvastatin 40 mg daily Antiplatelet therapy: aspirin 81 mg daily Previously recommended to continue current regimen  Erectile Dysfunction: Controlled per patient report; current treatment: tadalafil 10 mg PRN, but denies needing recently Previously recommended to continue current regimen  Tobacco Abuse: Uncontrolled. Offered to discuss today. He declines. He is  aware we can discuss this moving forward whenever he is ready to contemplate tobacco reduction/cessation  Supplementation: Current treatment: Vitamin B12, multivitamin,  Patient Goals/Self-Care Activities Over the next 90 days, patient will:  - take medications as prescribed check glucose twice daily, document, and provide at future appointments check blood pressure periodically, document, and provide at future appointments      Plan: Face to Face appointment with care management team member scheduled for: 4 business days  Chad Frye, PharmD, [place], [place] Pharmacist [employer] at [employer] [phone]

## 2021-06-18 ENCOUNTER — Other Ambulatory Visit: Payer: Self-pay | Admitting: Family Medicine

## 2021-06-18 DIAGNOSIS — E785 Hyperlipidemia, unspecified: Secondary | ICD-10-CM

## 2021-06-21 ENCOUNTER — Ambulatory Visit: Payer: BC Managed Care – PPO | Admitting: Pharmacist

## 2021-06-21 ENCOUNTER — Other Ambulatory Visit (INDEPENDENT_AMBULATORY_CARE_PROVIDER_SITE_OTHER): Payer: BC Managed Care – PPO

## 2021-06-21 ENCOUNTER — Other Ambulatory Visit: Payer: Self-pay

## 2021-06-21 DIAGNOSIS — I10 Essential (primary) hypertension: Secondary | ICD-10-CM

## 2021-06-21 DIAGNOSIS — E291 Testicular hypofunction: Secondary | ICD-10-CM

## 2021-06-21 DIAGNOSIS — E785 Hyperlipidemia, unspecified: Secondary | ICD-10-CM

## 2021-06-21 DIAGNOSIS — E119 Type 2 diabetes mellitus without complications: Secondary | ICD-10-CM

## 2021-06-21 LAB — CORTISOL: Cortisol, Plasma: 11.1 ug/dL

## 2021-06-21 LAB — T4, FREE: Free T4: 0.76 ng/dL (ref 0.60–1.60)

## 2021-06-21 LAB — IBC + FERRITIN
Ferritin: 79.8 ng/mL (ref 22.0–322.0)
Iron: 95 ug/dL (ref 42–165)
Saturation Ratios: 25.6 % (ref 20.0–50.0)
TIBC: 371 ug/dL (ref 250.0–450.0)
Transferrin: 265 mg/dL (ref 212.0–360.0)

## 2021-06-21 LAB — PROLACTIN: Prolactin: 16.9 ng/mL (ref 2.0–18.0)

## 2021-06-21 NOTE — Patient Instructions (Addendum)
It was great talking to you today!  Let me know how you feel about using the [place] 3.   We'll have a follow up video visit in ~ 4 weeks.   [name] Feliz Beam, PharmD

## 2021-06-21 NOTE — Chronic Care Management (AMB) (Signed)
Chronic Care Management CCM Pharmacy Note  06/21/2021 Name:  Chad Frye MRN:  [id number] DOB:  Feb 04, 1961  Summary: - Sample of Libre 3 CGM given today. Educated   Recommendations/Changes made from today's visit: - Continue current regimen at this time  Subjective: Chad Frye is an 61 y.o. year old male who is a primary patient of [name], [name], MD.  The CCM team was consulted for assistance with disease management and care coordination needs.    Engaged with patient face to face for follow up visit for pharmacy case management and/or care coordination services.   Objective:  Medications Reviewed Today     Reviewed by [name], RPH-CPP (Pharmacist) on 06/16/21 at 1408  Med List Status: <None>   Medication Order Taking? Sig Documenting Provider Last Dose Status Informant  ACCU-CHEK GUIDE test strip [id number]  USE AS DIRECTED 4 TIMES A DAY [name], MD  Active   Accu-Chek Softclix Lancets lancets [id number]  SMARTSIG:Topical [provider]  Active   amLODipine-benazepril (LOTREL) 10-40 MG capsule [id number] Yes TAKE 1 CAPSULE BY MOUTH EVERY DAY [name], MD Taking Active   aspirin 81 MG tablet [id number] Yes Take 1 tablet by mouth daily. [provider] Taking Active            Med Note [name]   Fri Oct 27, 2016  3:46 PM)    blood glucose meter kit and supplies KIT [id number]  Dispense based on patient and insurance preference. Use up to four times daily as directed. (FOR ICD-9 250.00, 250.01). [name], MD  Active   carvedilol (COREG) 12.5 MG tablet [id number] Yes TAKE 1 TABLET (12.5 MG TOTAL) BY MOUTH 2 (TWO) TIMES DAILY WITH A MEAL. [name], MD Taking Active   hydrochlorothiazide (HYDRODIURIL) 25 MG tablet [id number] Yes TAKE 1 TABLET BY MOUTH EVERY DAY [name], MD Taking Active   JARDIANCE 25 MG TABS tablet [id number] Yes TAKE 1 TABLET BY MOUTH DAILY BEFORE BREAKFAST. [name], MD Taking Active   levocetirizine (XYZAL) 5 MG tablet [id number] Yes TAKE 1 TABLET BY MOUTH EVERY DAY IN THE Cleatis Polka, MD Taking Active   MULTIPLE VITAMIN PO [id number] Yes Take 1 tablet by mouth daily. [provider] Taking Active            Med Note [name]   Fri Oct 27, 2016  3:57 PM)    rosuvastatin (CRESTOR) 40 MG tablet [id number] Yes TAKE 1 TABLET BY MOUTH EVERY DAY [name], MD Taking Active   Semaglutide, 2 MG/DOSE, 8 MG/3ML SOPN [id number] Yes Inject 2 mg as directed once a week. [name], MD Taking Active   tadalafil (CIALIS) 10 MG tablet [id number] Yes Please specify directions, refills and quantity [name], MD Taking Active   vitamin B-12 (CYANOCOBALAMIN) 1000 MCG tablet [id number] Yes Take 1,000 mcg by mouth daily. [provider] Taking Active             Pertinent Labs:   Lab Results  Component Value Date   HGBA1C 7.3 (H) 05/02/2021   Lab Results  Component Value Date   CHOL 115 01/25/2021   HDL 30.40 (L) 01/25/2021   LDLCALC 62 01/25/2021   LDLDIRECT 171.0 11/26/2018   TRIG 111.0 01/25/2021   CHOLHDL 4 01/25/2021   Lab Results  Component Value Date   CREATININE 0.77 01/25/2021   BUN 12 01/25/2021   NA  135 01/25/2021   K 4.4 01/25/2021   CL 101 01/25/2021   CO2 26 01/25/2021    SDOH:  (Social Determinants of Health) assessments and interventions performed:  SDOH Interventions    Flowsheet Row Most Recent Value  SDOH Interventions   Financial Strain Interventions Intervention Not Indicated       CCM Care Plan  Review of patient past medical history, allergies, medications, health status, including review of consultants reports, laboratory and other test data, was performed as part of comprehensive evaluation and provision of chronic care management services.   Care Plan : Medication Management  Updates made by [name], RPH-CPP since 06/21/2021 12:00 AM      Problem: T2DM, HTN, HLD, ED   Priority: High  Onset Date: 09/13/2020     Long-Range Goal: Disease Progression Prevention   Start Date: 09/13/2020  Recent Progress: On track  Priority: High  Note:   Current Barriers:  Unable to achieve control of chronic diseases (diabetes, hypertension, hyperlipidemia)  Pharmacist Clinical Goal(s):  Over the next 90 days, patient will achieve adherence to monitoring guidelines and medication adherence to achieve therapeutic efficacy and achieve control of diabetes, hypertension, and hyperlipidemia through collaboration with PharmD and provider.   Interventions: 1:1 collaboration with [name], MD regarding development and update of comprehensive plan of care as evidenced by provider attestation and co-signature Inter-disciplinary care team collaboration (see longitudinal plan of care) Comprehensive medication review performed; medication list updated in electronic medical record  Health Maintenance   Yearly diabetic eye exam: up to date Yearly diabetic foot exam: up to date Urine microalbumin: up to date Yearly influenza vaccination: due - declined Td/Tdap vaccination: up to date Pneumonia vaccination: up to date COVID vaccinations: due - declined Shingrix vaccinations: due - declined Colonoscopy: due  Diabetes: Uncontrolled per last A1c; current treatment: Ozempic 2 mg weekly, Jardiance 25 mg daily Hx of metformin - due to feeling bad, fatigue, and no energy. Denied GI issues with metformin. Reported eeling better since discontinuation.  Educated on use of Libre 3 CGM. Provided sample. Patient placed first sensor while in office. Follow to determine if patient will choose to continue with CGM use.   Hypertension: Controlled per home readings; current treatment: amlodipine/benazepril 10/40 mg QD, carvedilol 12.5 mg BID, HCTZ 25 mg daily Previously recommended to continue current regimen at this time.   Hyperlipidemia: Controlled  per last lipid panel; current treatment: rosuvastatin 40 mg daily Antiplatelet therapy: aspirin 81 mg daily Previously recommended to continue current regimen  Erectile Dysfunction: Controlled per patient report; current treatment: tadalafil 10 mg PRN, but denies needing recently Previously recommended to continue current regimen  Tobacco Abuse: Uncontrolled. Previously declined to discuss. He is aware we can discuss this moving forward whenever he is ready to contemplate tobacco reduction/cessation  Supplementation: Current treatment: Vitamin B12, multivitamin,  Patient Goals/Self-Care Activities Over the next 90 days, patient will:  - take medications as prescribed check glucose twice daily, document, and provide at future appointments check blood pressure periodically, document, and provide at future appointments      Plan: Telephone follow up appointment with care management team member scheduled for:  4-6 weeks  [name] Darnelle Maffucci, PharmD, [place], CPP Clinical Pharmacist [employer] at [employer] [phone]

## 2021-06-27 DIAGNOSIS — Z1211 Encounter for screening for malignant neoplasm of colon: Secondary | ICD-10-CM | POA: Diagnosis not present

## 2021-06-27 LAB — COLOGUARD: Cologuard: NEGATIVE

## 2021-07-03 LAB — COLOGUARD: Cologuard: NEGATIVE

## 2021-07-13 ENCOUNTER — Telehealth: Payer: Self-pay | Admitting: Family Medicine

## 2021-07-13 NOTE — Telephone Encounter (Signed)
Patient called to get his Cologuard resutls.

## 2021-07-13 NOTE — Telephone Encounter (Signed)
I called and spoke with the patient and informed him of his negative cologuard results and he understood.  Chad Frye,cma

## 2021-07-14 NOTE — Telephone Encounter (Signed)
I will send to [place] to check on this referral.

## 2021-07-20 ENCOUNTER — Telehealth: Payer: BC Managed Care – PPO | Admitting: Pharmacist

## 2021-07-20 DIAGNOSIS — E119 Type 2 diabetes mellitus without complications: Secondary | ICD-10-CM

## 2021-07-20 NOTE — Patient Instructions (Signed)
Chad Frye,  ? ?Keep up the great work! We want to keep your glucose within the target range of 80-180 as much as possible.  ? ?Take care,  ? ?[name] Feliz Beam, PharmD ?

## 2021-07-20 NOTE — Progress Notes (Signed)
? ?   ? ?Chief Complaint  ?Patient presents with  ? Diabetes  ? ? ?Chad Frye is a 61 y.o. year old male who was referred for medication management by their primary care provider, [name], [name], MD. They presented for a virtual visit in the context of the COVID-19 pandemic. We were unable to connect via video so we proceeded with a telephone visit ? ?Subjective: ?Diabetes: ? ?Current medications: Ozempic 2 mg weekly - Injecting 1.5 mg weekly, max tolerated dose; Jardiance 25 mg daily,  ?Medications tried in the past: metformin - fatigue, GI upset ? ?Date of Download: 2/16-07/20/21 ?% Time CGM is active: 92% ?Average Glucose: 132 mg/dL ?Glucose Management Indicator: 6.5%  ?Glucose Variability: 27.2 (goal <36%) ?Time in Goal:  ?- Time in range 70-180: 91% ?- Time above range: 8% ?- Time below range: 1% ?Observed patterns: occasional post prandial elevations ? ? ?Patient denies hypoglycemic s/sx including dizziness, shakiness, sweating but does report an episode overnight where it read <55, but he had no symptoms ?  ? ?Objective: ?Lab Results  ?Component Value Date  ? HGBA1C 7.3 (H) 05/02/2021  ? ? ?Lab Results  ?Component Value Date  ? CREATININE 0.77 01/25/2021  ? BUN 12 01/25/2021  ? NA 135 01/25/2021  ? K 4.4 01/25/2021  ? CL 101 01/25/2021  ? CO2 26 01/25/2021  ? ? ?Lab Results  ?Component Value Date  ? CHOL 115 01/25/2021  ? HDL 30.40 (L) 01/25/2021  ? [place] 62 01/25/2021  ? LDLDIRECT 171.0 11/26/2018  ? TRIG 111.0 01/25/2021  ? CHOLHDL 4 01/25/2021  ? ? ?Medications Reviewed Today   ? ? Reviewed by [name], RPH-CPP (Pharmacist) on 06/16/21 at 1408  Med List Status: <None>  ? ?Medication Order Taking? Sig Documenting Provider Last Dose Status Informant  ?ACCU-CHEK GUIDE test strip [id number]  USE AS DIRECTED 4 TIMES A DAY [name], MD  Active   ?Accu-Chek Softclix Lancets lancets [id number]  SMARTSIG:Topical [provider]  Active   ?amLODipine-benazepril (LOTREL) 10-40 MG  capsule [id number] Yes TAKE 1 CAPSULE BY MOUTH EVERY DAY [name], MD Taking Active   ?aspirin 81 MG tablet [id number] Yes Take 1 tablet by mouth daily. [provider] Taking Active   ?         ?Med Note [name]   Fri Oct 27, 2016  3:46 PM)    ?blood glucose meter kit and supplies KIT [id number]  Dispense based on patient and insurance preference. Use up to four times daily as directed. (FOR ICD-9 250.00, 250.01). [name], MD  Active   ?carvedilol (COREG) 12.5 MG tablet [id number] Yes TAKE 1 TABLET (12.5 MG TOTAL) BY MOUTH 2 (TWO) TIMES DAILY WITH A MEAL. [name], MD Taking Active   ?hydrochlorothiazide (HYDRODIURIL) 25 MG tablet [id number] Yes TAKE 1 TABLET BY MOUTH EVERY DAY [name], MD Taking Active   ?JARDIANCE 25 MG TABS tablet [id number] Yes TAKE 1 TABLET BY MOUTH DAILY BEFORE BREAKFAST. [name], MD Taking Active   ?levocetirizine (XYZAL) 5 MG tablet [id number] Yes TAKE 1 TABLET BY MOUTH EVERY DAY IN THE Cleatis Polka, MD Taking Active   ?MULTIPLE VITAMIN PO [id number] Yes Take 1 tablet by mouth daily. [provider] Taking Active   ?         ?Med Note [name]   Fri Oct 27, 2016  3:57 PM)    ?rosuvastatin (CRESTOR) 40 MG tablet  [id number] Yes TAKE 1 TABLET BY MOUTH EVERY DAY [name], MD Taking Active   ?Semaglutide, 2 MG/DOSE, 8 MG/3ML SOPN [id number] Yes Inject 2 mg as directed once a week. [name], MD Taking Active   ?tadalafil (CIALIS) 10 MG tablet [id number] Yes Please specify directions, refills and quantity [name], MD Taking Active   ?vitamin B-12 (CYANOCOBALAMIN) 1000 MCG tablet [id number] Yes Take 1,000 mcg by mouth daily. [provider] Taking Active   ? ?  ?  ? ?  ? ? ?Assessment/Plan:  ? ?Diabetes: ?- Currently controlled per CGM data  ?- Recommended to continue current regimen at this time ?- Reviewed lifestyle modifications including: moderating carbohydrate  portions sizes when he sees post prandial elevations in glucose readings ? ? ?Follow Up Plan: Pharmacy needs met. PCP follow up in 2 weeks ? ?[name] Darnelle Maffucci, PharmD, BCACP, CPP ?Clinical Pharmacist ?Therapist, music at [employer] ?[phone] ? ? ? ?

## 2021-08-01 ENCOUNTER — Ambulatory Visit: Payer: BC Managed Care – PPO | Admitting: Family Medicine

## 2021-08-01 ENCOUNTER — Encounter: Payer: Self-pay | Admitting: Family Medicine

## 2021-08-01 ENCOUNTER — Other Ambulatory Visit: Payer: Self-pay

## 2021-08-01 VITALS — BP 110/60 | HR 70 | Temp 98.1°F | Ht 73.0 in | Wt 192.0 lb

## 2021-08-01 DIAGNOSIS — E291 Testicular hypofunction: Secondary | ICD-10-CM | POA: Diagnosis not present

## 2021-08-01 DIAGNOSIS — I10 Essential (primary) hypertension: Secondary | ICD-10-CM

## 2021-08-01 DIAGNOSIS — E119 Type 2 diabetes mellitus without complications: Secondary | ICD-10-CM

## 2021-08-01 LAB — BASIC METABOLIC PANEL
BUN: 11 mg/dL (ref 6–23)
CO2: 30 mEq/L (ref 19–32)
Calcium: 9.7 mg/dL (ref 8.4–10.5)
Chloride: 97 mEq/L (ref 96–112)
Creatinine, Ser: 0.73 mg/dL (ref 0.40–1.50)
GFR: 99.6 mL/min (ref >60.00)
Glucose, Bld: 163 mg/dL — ABNORMAL HIGH (ref 70–99)
Potassium: 4.5 mEq/L (ref 3.5–5.1)
Sodium: 135 mEq/L (ref 135–145)

## 2021-08-01 LAB — HEMOGLOBIN A1C: Hgb A1c MFr Bld: 7.1 % — ABNORMAL HIGH (ref 4.6–6.5)

## 2021-08-01 NOTE — Patient Instructions (Signed)
Nice to see you. We will check lab work today and contact you with the results. 

## 2021-08-01 NOTE — Assessment & Plan Note (Signed)
Well-controlled.  She will continue amlodipine-benazepril 1 tablet daily, carvedilol 12.5 mg twice daily, and HCTZ 25 mg daily.  Check labs. ?

## 2021-08-01 NOTE — Progress Notes (Signed)
?Chad Rumps, MD ?Phone: [phone] ? ?Chad Frye is a 61 y.o. male who presents today for f/u. ? ?HYPERTENSION ?Disease Monitoring ?Home BP Monitoring similar to today Chest pain- no    Dyspnea- no ?Medications ?Compliance-  taking amlodipine, benazepril, coreg, HCTZ.   Edema- no ?BMET ?   ?Component Value Date/Time  ? NA 135 01/25/2021 0957  ? K 4.4 01/25/2021 0957  ? CL 101 01/25/2021 0957  ? CO2 26 01/25/2021 0957  ? GLUCOSE 98 01/25/2021 0957  ? BUN 12 01/25/2021 0957  ? BUN 12 02/13/2014 0000  ? CREATININE 0.77 01/25/2021 0957  ? CREATININE 0.92 02/15/2018 1554  ? CALCIUM 9.4 01/25/2021 0957  ? ?DIABETES ?Disease Monitoring: ?Blood Sugar ranges-using CGM, notes estimated A1c is 6.5% Polyuria/phagia/dipsia- no      Optho- UTD ?Medications: ?Compliance- taking jardiance, ozempic Hypoglycemic symptoms- no, though notes his CGM alarm goes off occasionally at night, states the pharmacist advised this was likely related to him laying on the arm ? ?Hypogonadism: has not been able to get in to see endo yet. There has been difficulty finding an appointment.  ? ? ? ?Social History  ? ?Tobacco Use  ?Smoking Status Every Day  ? Types: Cigarettes  ? Last attempt to quit: 11/28/2015  ? Years since quitting: 5.6  ?Smokeless Tobacco Never  ? ? ?Current Outpatient Medications on File Prior to Visit  ?Medication Sig Dispense Refill  ? ACCU-CHEK GUIDE test strip USE AS DIRECTED 4 TIMES A DAY 100 strip 1  ? Accu-Chek Softclix Lancets lancets SMARTSIG:Topical    ? amLODipine-benazepril (LOTREL) 10-40 MG capsule TAKE 1 CAPSULE BY MOUTH EVERY DAY 30 capsule 5  ? blood glucose meter kit and supplies KIT Dispense based on patient and insurance preference. Use up to four times daily as directed. (FOR ICD-9 250.00, 250.01). 1 each 0  ? carvedilol (COREG) 12.5 MG tablet TAKE 1 TABLET (12.5 MG TOTAL) BY MOUTH 2 (TWO) TIMES DAILY WITH A MEAL. 60 tablet 5  ? Continuous Blood Gluc Sensor (FREESTYLE LIBRE 3 SENSOR) MISC Apply 1 each  topically every 14 (fourteen) days. Place 1 sensor on the skin every 14 days. Use to check glucose continuously 2 each 11  ? hydrochlorothiazide (HYDRODIURIL) 25 MG tablet TAKE 1 TABLET BY MOUTH EVERY DAY 30 tablet 5  ? JARDIANCE 25 MG TABS tablet TAKE 1 TABLET BY MOUTH DAILY BEFORE BREAKFAST. 30 tablet 5  ? levocetirizine (XYZAL) 5 MG tablet TAKE 1 TABLET BY MOUTH EVERY DAY IN THE EVENING 90 tablet 1  ? MULTIPLE VITAMIN PO Take 1 tablet by mouth daily.    ? rosuvastatin (CRESTOR) 40 MG tablet TAKE 1 TABLET BY MOUTH EVERY DAY 30 tablet 5  ? Semaglutide, 2 MG/DOSE, 8 MG/3ML SOPN Inject 2 mg as directed once a week. 9 mL 3  ? tadalafil (CIALIS) 10 MG tablet Please specify directions, refills and quantity 10 tablet 0  ? vitamin B-12 (CYANOCOBALAMIN) 1000 MCG tablet Take 1,000 mcg by mouth daily.    ? ?No current facility-administered medications on file prior to visit.  ? ? ? ?ROS see history of present illness ? ?Objective ? ?Physical Exam ?Vitals:  ? 08/01/21 0837  ?BP: 110/60  ?Pulse: 70  ?Temp: 98.1 ?F (36.7 ?C)  ?SpO2: 98%  ? ? ?BP Readings from Last 3 Encounters:  ?08/01/21 110/60  ?05/02/21 110/70  ?01/25/21 110/70  ? ?Wt Readings from Last 3 Encounters:  ?08/01/21 192 lb (87.1 kg)  ?05/02/21 187 lb 3.2 oz (84.9 kg)  ?  01/25/21 191 lb 3.2 oz (86.7 kg)  ? ? ?Physical Exam ?Constitutional:   ?   General: He is not in acute distress. ?   Appearance: He is not diaphoretic.  ?Cardiovascular:  ?   Rate and Rhythm: Normal rate and regular rhythm.  ?   Heart sounds: Normal heart sounds.  ?Pulmonary:  ?   Effort: Pulmonary effort is normal.  ?   Breath sounds: Normal breath sounds.  ?Skin: ?   General: Skin is warm and dry.  ?Neurological:  ?   Mental Status: He is alert.  ? ? ? ?Assessment/Plan: Please see individual problem list. ? ?Problem List Items Addressed This Visit   ? ? Diabetes mellitus type 2, uncomplicated ([employer])  ?  Seems to be well controlled.  We will check an A1c.  He will continue Jardiance 25 mg once  daily and Ozempic 2 mg weekly. ?  ?  ? Relevant Orders  ? HgB A1c  ? Essential hypertension - Primary  ?  Well-controlled.  She will continue amlodipine-benazepril 1 tablet daily, carvedilol 12.5 mg twice daily, and HCTZ 25 mg daily.  Check labs. ?  ?  ? Relevant Orders  ? Basic Metabolic Panel (BMET)  ? Hypogonadism in male  ?  We will check on endocrinology for the patient. ?  ?  ? Relevant Orders  ? Ambulatory referral to Endocrinology  ? ? ?Return in about 6 months (around 02/01/2022) for Hypertension/diabetes. ? ?This visit occurred during the SARS-CoV-2 public health emergency.  Safety protocols were in place, including screening questions prior to the visit, additional usage of staff PPE, and extensive cleaning of exam room while observing appropriate contact time as indicated for disinfecting solutions.  ? ? ?Chad Rumps, MD ?[place] ? ?

## 2021-08-01 NOTE — Assessment & Plan Note (Signed)
We will check on endocrinology for the patient. ?

## 2021-08-01 NOTE — Assessment & Plan Note (Signed)
Seems to be well controlled.  We will check an A1c.  He will continue Jardiance 25 mg once daily and Ozempic 2 mg weekly. ?

## 2021-08-05 ENCOUNTER — Encounter: Payer: Self-pay | Admitting: Family Medicine

## 2021-08-08 ENCOUNTER — Other Ambulatory Visit: Payer: Self-pay

## 2021-08-08 DIAGNOSIS — E291 Testicular hypofunction: Secondary | ICD-10-CM

## 2021-10-12 DIAGNOSIS — F4320 Adjustment disorder, unspecified: Secondary | ICD-10-CM | POA: Diagnosis not present

## 2021-10-24 DIAGNOSIS — F4320 Adjustment disorder, unspecified: Secondary | ICD-10-CM | POA: Diagnosis not present

## 2021-11-05 ENCOUNTER — Other Ambulatory Visit: Payer: Self-pay | Admitting: Family Medicine

## 2021-11-23 DIAGNOSIS — E291 Testicular hypofunction: Secondary | ICD-10-CM | POA: Diagnosis not present

## 2021-12-01 ENCOUNTER — Other Ambulatory Visit: Payer: Self-pay | Admitting: Family Medicine

## 2022-01-22 ENCOUNTER — Other Ambulatory Visit: Payer: Self-pay | Admitting: Family Medicine

## 2022-01-22 DIAGNOSIS — E119 Type 2 diabetes mellitus without complications: Secondary | ICD-10-CM

## 2022-01-31 ENCOUNTER — Encounter: Payer: Self-pay | Admitting: Family Medicine

## 2022-01-31 ENCOUNTER — Telehealth (INDEPENDENT_AMBULATORY_CARE_PROVIDER_SITE_OTHER): Payer: BC Managed Care – PPO | Admitting: Family Medicine

## 2022-01-31 VITALS — Ht 73.0 in | Wt 190.0 lb

## 2022-01-31 DIAGNOSIS — F17210 Nicotine dependence, cigarettes, uncomplicated: Secondary | ICD-10-CM

## 2022-01-31 DIAGNOSIS — I10 Essential (primary) hypertension: Secondary | ICD-10-CM

## 2022-01-31 DIAGNOSIS — E291 Testicular hypofunction: Secondary | ICD-10-CM | POA: Diagnosis not present

## 2022-01-31 DIAGNOSIS — E119 Type 2 diabetes mellitus without complications: Secondary | ICD-10-CM

## 2022-01-31 DIAGNOSIS — R809 Proteinuria, unspecified: Secondary | ICD-10-CM

## 2022-01-31 DIAGNOSIS — E785 Hyperlipidemia, unspecified: Secondary | ICD-10-CM

## 2022-01-31 NOTE — Assessment & Plan Note (Signed)
He will continue to follow with endocrinology. 

## 2022-01-31 NOTE — Assessment & Plan Note (Signed)
Check lipid panel.  Continue Crestor 40 mg daily. 

## 2022-01-31 NOTE — Assessment & Plan Note (Signed)
Check urine microalbumin. ?

## 2022-01-31 NOTE — Progress Notes (Signed)
Virtual Visit via telephone Note  This visit type was conducted due to national recommendations for restrictions regarding the COVID-19 pandemic (e.g. social distancing).  This format is felt to be most appropriate for this patient at this time.  All issues noted in this document were discussed and addressed.  No physical exam was performed (except for noted visual exam findings with Video Visits).   I connected with Chad Frye today at  8:30 AM EDT by a video enabled telemedicine application and verified that I am speaking with the correct person using two identifiers. Location patient: home Location provider: home office Persons participating in the virtual visit: patient, provider  I discussed the limitations, risks, security and privacy concerns of performing an evaluation and management service by telephone and the availability of in person appointments. I also discussed with the patient that there may be a patient responsible charge related to this service. The patient expressed understanding and agreed to proceed.  Interactive audio and video telecommunications were attempted between this provider and patient, however failed, due to patient having technical difficulties OR patient did not have access to video capability.  We continued and completed visit with audio only.  Reason for visit: f/u.  HPI: HYPERTENSION Disease Monitoring Home BP Monitoring 118-129/70s Chest pain- no    Dyspnea- no Medications Compliance-  taking amlodipine, benazepril, coreg, HCTZ.  Edema- no BMET    Component Value Date/Time   NA 135 08/01/2021 0855   K 4.5 08/01/2021 0855   CL 97 08/01/2021 0855   CO2 30 08/01/2021 0855   GLUCOSE 163 (H) 08/01/2021 0855   BUN 11 08/01/2021 0855   BUN 12 02/13/2014 0000   CREATININE 0.73 08/01/2021 0855   CREATININE 0.92 02/15/2018 1554   CALCIUM 9.7 08/01/2021 0855   DIABETES Disease Monitoring: Blood Sugar ranges-103, up to 180-200 2 hours  postprandial Polyuria/phagia/dipsia- no      Optho- UTD Medications: Compliance- taking jardiance, ozempic, notes he was able to increase the ozempic dose to 2 mg weekly  Hypoglycemic symptoms- no  Hypogonadism: Patient saw endocrinology.  They placed him on testosterone gel and another pill though he does not know the name of that.  He follows up with them in October.  He feels better since going on the testosterone.  Tobacco abuse: Patient has smoked 1-1.5 packs/day for many years.  He is not ready to quit at this time.  He has tried Chantix in the past with adverse effects.  He also tried Wellbutrin though that was not helpful.  ROS: See pertinent positives and negatives per HPI.  Past Medical History:  Diagnosis Date   DM type 2 (diabetes mellitus, type 2) ([place])    Essential hypertension    Hyperlipidemia    Seasonal allergies     Past Surgical History:  Procedure Laterality Date   APPENDECTOMY     VASECTOMY      Family History  Problem Relation Age of Onset   Diabetes Mother    Heart disease Mother    Stroke Father    Prostate cancer Father    Hypertension Father    COPD Sister    Heart disease Brother        single heart bypass   Hypertension Maternal Grandmother     SOCIAL HX: smoker   Current Outpatient Medications:    amLODipine-benazepril (LOTREL) 10-40 MG capsule, TAKE 1 CAPSULE BY MOUTH EVERY DAY, Disp: 30 capsule, Rfl: 5   cabergoline (DOSTINEX) 0.5 MG tablet, Take by mouth.,  Disp: , Rfl:    carvedilol (COREG) 12.5 MG tablet, TAKE 1 TABLET (12.5 MG TOTAL) BY MOUTH 2 (TWO) TIMES DAILY WITH A MEAL., Disp: 60 tablet, Rfl: 5   Continuous Blood Gluc Sensor (FREESTYLE LIBRE 3 SENSOR) MISC, Apply 1 each topically every 14 (fourteen) days. Place 1 sensor on the skin every 14 days. Use to check glucose continuously, Disp: 2 each, Rfl: 11   hydrochlorothiazide (HYDRODIURIL) 25 MG tablet, TAKE 1 TABLET BY MOUTH EVERY DAY, Disp: 90 tablet, Rfl: 1   JARDIANCE 25 MG TABS  tablet, TAKE 1 TABLET BY MOUTH EVERY DAY BEFORE BREAKFAST, Disp: 30 tablet, Rfl: 5   levocetirizine (XYZAL) 5 MG tablet, TAKE 1 TABLET BY MOUTH EVERY DAY IN THE EVENING, Disp: 90 tablet, Rfl: 1   MULTIPLE VITAMIN PO, Take 1 tablet by mouth daily., Disp: , Rfl:    rosuvastatin (CRESTOR) 40 MG tablet, TAKE 1 TABLET BY MOUTH EVERY DAY, Disp: 30 tablet, Rfl: 5   Semaglutide, 2 MG/DOSE, 8 MG/3ML SOPN, Inject 2 mg as directed once a week., Disp: 9 mL, Rfl: 3   tadalafil (CIALIS) 10 MG tablet, Please specify directions, refills and quantity, Disp: 10 tablet, Rfl: 0   Testosterone 20.25 MG/ACT (1.62%) GEL, SMARTSIG:2 pump Topical Daily, Disp: , Rfl:    vitamin B-12 (CYANOCOBALAMIN) 1000 MCG tablet, Take 1,000 mcg by mouth daily., Disp: , Rfl:   EXAM: This was a telephone visit and thus no exam was completed.  ASSESSMENT AND PLAN:  Discussed the following assessment and plan:  Problem List Items Addressed This Visit     Diabetes mellitus type 2, uncomplicated (HCC) (Chronic)    Check A1c.  Continue Ozempic 2 mg weekly and Jardiance 25 mg daily.      Relevant Orders   Hemoglobin A1c   Urine Microalbumin w/creat. ratio   Essential hypertension - Primary (Chronic)    Well-controlled.  He will continue HCTZ 25 mg daily, carvedilol 12.5 mg twice daily, amlodipine-benazepril 10-40 mg daily.  Check labs.      Relevant Orders   Basic Metabolic Panel (BMET)   Comp Met (CMET)   Hyperlipidemia (Chronic)    Check lipid panel.  Continue Crestor 40 mg daily.      Relevant Orders   Lipid panel   Comp Met (CMET)   Hypogonadism in male (Chronic)    He will continue to follow with endocrinology.      Microalbuminuria    Check urine microalbumin.      Nicotine dependence, cigarettes, uncomplicated    Counseled on smoking cessation.  The patient is not ready to quit.  He will let me know when he is ready.  Discussed lung cancer screening.  Order placed.      Relevant Orders   Ambulatory  Referral for Lung Cancer Scre    Return in about 3 weeks (around 02/21/2022).   I discussed the assessment and treatment plan with the patient. The patient was provided an opportunity to ask questions and all were answered. The patient agreed with the plan and demonstrated an understanding of the instructions.   The patient was advised to call back or seek an in-person evaluation if the symptoms worsen or if the condition fails to improve as anticipated.  I provided 11 minutes of non-face-to-face time during this encounter.   Tommi Rumps, MD

## 2022-01-31 NOTE — Assessment & Plan Note (Signed)
Well-controlled.  He will continue HCTZ 25 mg daily, carvedilol 12.5 mg twice daily, amlodipine-benazepril 10-40 mg daily.  Check labs.

## 2022-01-31 NOTE — Assessment & Plan Note (Signed)
Check A1c.  Continue Ozempic 2 mg weekly and Jardiance 25 mg daily.

## 2022-01-31 NOTE — Assessment & Plan Note (Signed)
Counseled on smoking cessation.  The patient is not ready to quit.  He will let me know when he is ready.  Discussed lung cancer screening.  Order placed.

## 2022-02-06 ENCOUNTER — Other Ambulatory Visit (INDEPENDENT_AMBULATORY_CARE_PROVIDER_SITE_OTHER): Payer: BC Managed Care – PPO

## 2022-02-06 DIAGNOSIS — I10 Essential (primary) hypertension: Secondary | ICD-10-CM

## 2022-02-06 DIAGNOSIS — E785 Hyperlipidemia, unspecified: Secondary | ICD-10-CM

## 2022-02-06 DIAGNOSIS — E119 Type 2 diabetes mellitus without complications: Secondary | ICD-10-CM

## 2022-02-06 LAB — HEMOGLOBIN A1C: Hgb A1c MFr Bld: 7 % — ABNORMAL HIGH (ref 4.6–6.5)

## 2022-02-06 LAB — COMPREHENSIVE METABOLIC PANEL
ALT: 17 U/L (ref 0–53)
AST: 18 U/L (ref 0–37)
Albumin: 4.3 g/dL (ref 3.5–5.2)
Alkaline Phosphatase: 100 U/L (ref 39–117)
BUN: 14 mg/dL (ref 6–23)
CO2: 28 mEq/L (ref 19–32)
Calcium: 9.5 mg/dL (ref 8.4–10.5)
Chloride: 99 mEq/L (ref 96–112)
Creatinine, Ser: 0.86 mg/dL (ref 0.40–1.50)
GFR: 94.45 mL/min (ref >60.00)
Glucose, Bld: 105 mg/dL — ABNORMAL HIGH (ref 70–99)
Potassium: 4.3 mEq/L (ref 3.5–5.1)
Sodium: 136 mEq/L (ref 135–145)
Total Bilirubin: 0.6 mg/dL (ref 0.2–1.2)
Total Protein: 6.9 g/dL (ref 6.0–8.3)

## 2022-02-06 LAB — BASIC METABOLIC PANEL
BUN: 14 mg/dL (ref 6–23)
CO2: 28 mEq/L (ref 19–32)
Calcium: 9.5 mg/dL (ref 8.4–10.5)
Chloride: 99 mEq/L (ref 96–112)
Creatinine, Ser: 0.86 mg/dL (ref 0.40–1.50)
GFR: 94.45 mL/min (ref >60.00)
Glucose, Bld: 105 mg/dL — ABNORMAL HIGH (ref 70–99)
Potassium: 4.3 mEq/L (ref 3.5–5.1)
Sodium: 136 mEq/L (ref 135–145)

## 2022-02-06 LAB — LIPID PANEL
Cholesterol: 117 mg/dL (ref 0–200)
HDL: 34.8 mg/dL — ABNORMAL LOW (ref >39.00)
LDL Cholesterol: 58 mg/dL (ref 0–99)
NonHDL: 81.83
Total CHOL/HDL Ratio: 3
Triglycerides: 118 mg/dL (ref 0.0–149.0)
VLDL: 23.6 mg/dL (ref 0.0–40.0)

## 2022-02-06 LAB — MICROALBUMIN / CREATININE URINE RATIO
Creatinine,U: 61.7 mg/dL
Microalb Creat Ratio: 1.1 mg/g (ref 0.0–30.0)
Microalb, Ur: <0.7 mg/dL (ref 0.0–1.9)

## 2022-02-07 ENCOUNTER — Telehealth: Payer: Self-pay | Admitting: Family Medicine

## 2022-02-07 NOTE — Telephone Encounter (Signed)
Lft pt vm and gave pt the number on vm to call to follow up on referral. thanks

## 2022-02-15 ENCOUNTER — Other Ambulatory Visit: Payer: Self-pay | Admitting: Family Medicine

## 2022-02-15 DIAGNOSIS — E785 Hyperlipidemia, unspecified: Secondary | ICD-10-CM

## 2022-03-09 DIAGNOSIS — E119 Type 2 diabetes mellitus without complications: Secondary | ICD-10-CM | POA: Diagnosis not present

## 2022-03-09 LAB — HM DIABETES EYE EXAM

## 2022-03-21 DIAGNOSIS — E291 Testicular hypofunction: Secondary | ICD-10-CM | POA: Diagnosis not present

## 2022-03-21 DIAGNOSIS — E221 Hyperprolactinemia: Secondary | ICD-10-CM | POA: Diagnosis not present

## 2022-04-11 ENCOUNTER — Other Ambulatory Visit: Payer: Self-pay | Admitting: Family Medicine

## 2022-04-24 ENCOUNTER — Ambulatory Visit: Payer: BC Managed Care – PPO | Admitting: Family Medicine

## 2022-05-05 ENCOUNTER — Ambulatory Visit: Payer: BC Managed Care – PPO | Admitting: Family Medicine

## 2022-05-29 ENCOUNTER — Other Ambulatory Visit: Payer: Self-pay | Admitting: Family Medicine

## 2022-06-09 ENCOUNTER — Ambulatory Visit: Payer: BC Managed Care – PPO | Admitting: Family Medicine

## 2022-06-09 ENCOUNTER — Encounter: Payer: Self-pay | Admitting: Family Medicine

## 2022-06-09 VITALS — BP 110/70 | HR 66 | Temp 97.6°F | Ht 73.0 in | Wt 193.8 lb

## 2022-06-09 DIAGNOSIS — I10 Essential (primary) hypertension: Secondary | ICD-10-CM

## 2022-06-09 DIAGNOSIS — E119 Type 2 diabetes mellitus without complications: Secondary | ICD-10-CM

## 2022-06-09 DIAGNOSIS — F17210 Nicotine dependence, cigarettes, uncomplicated: Secondary | ICD-10-CM | POA: Diagnosis not present

## 2022-06-09 LAB — POCT GLYCOSYLATED HEMOGLOBIN (HGB A1C): Hemoglobin A1C: 6.9 % — AB (ref 4.0–5.6)

## 2022-06-09 NOTE — Patient Instructions (Addendum)
Nice to see you.  Please let me know if the lung cancer screening people do not call you in the next 2 weeks.  You can go ahead and reduce the ozempic to 1 mg weekly to cut out the nausea. We can see what your A1c is in 3 months.

## 2022-06-09 NOTE — Assessment & Plan Note (Signed)
Stable. A1c is 6.9, down from 7.0. Given the patient's nausea, he can take the 1mg  of semaglutide instead of the 2.0mg  dose. He will continue Jardiance 25mg  daily.

## 2022-06-09 NOTE — Progress Notes (Signed)
Tommi Rumps, MD Phone: [phone]  Chad Frye is a 62 y.o. male who presents today for f/u.   Hypertension: Patient does check his blood pressures at home and they range in the 110-120s/70-80s. Patient is currently taking amlodipine-benazepril 10-40mg  daily, carvedilol 12.5mg  twice daily, and hydrochlorothiazide 25mg  daily. He denies any chest pain, shortness of breath, or edema.   Diabetes: Patient has a [employer] but currently isn't using it. He occasionally will check his blood sugars at home and this morning it was 97. He is taking Jardiance 25mg  daily and semaglutide 2mg  weekly. He does report being nauseous for about a day after taking the 2mg  dose of semaglutide, so he's been alternating between taking the 2mg  versus the 1mg . Patient denies any polyuria, polydipsia, or hypoglycemic episodes. He is up to date with his eye doctor.   Social History   Tobacco Use  Smoking Status Former   Types: Cigarettes   Quit date: 11/28/2015   Years since quitting: 6.5   Passive exposure: Never  Smokeless Tobacco Never    Current Outpatient Medications on File Prior to Visit  Medication Sig Dispense Refill   amLODipine-benazepril (LOTREL) 10-40 MG capsule TAKE 1 CAPSULE BY MOUTH EVERY DAY 30 capsule 5   cabergoline (DOSTINEX) 0.5 MG tablet Take by mouth.     carvedilol (COREG) 12.5 MG tablet TAKE 1 TABLET BY MOUTH TWICE A DAY WITH MEALS 180 tablet 0   Continuous Blood Gluc Sensor (FREESTYLE LIBRE 3 SENSOR) MISC Apply 1 each topically every 14 (fourteen) days. Place 1 sensor on the skin every 14 days. Use to check glucose continuously 2 each 11   hydrochlorothiazide (HYDRODIURIL) 25 MG tablet TAKE 1 TABLET BY MOUTH EVERY DAY 90 tablet 1   JARDIANCE 25 MG TABS tablet TAKE 1 TABLET BY MOUTH EVERY DAY BEFORE BREAKFAST 30 tablet 5   levocetirizine (XYZAL) 5 MG tablet TAKE 1 TABLET BY MOUTH EVERY DAY IN THE EVENING 90 tablet 1   MULTIPLE VITAMIN PO Take 1 tablet by mouth daily.      rosuvastatin (CRESTOR) 40 MG tablet TAKE 1 TABLET BY MOUTH EVERY DAY 90 tablet 3   Semaglutide, 2 MG/DOSE, 8 MG/3ML SOPN Inject 2 mg as directed once a week. 9 mL 3   tadalafil (CIALIS) 10 MG tablet Please specify directions, refills and quantity 10 tablet 0   Testosterone 20.25 MG/ACT (1.62%) GEL SMARTSIG:2 pump Topical Daily     vitamin B-12 (CYANOCOBALAMIN) 1000 MCG tablet Take 1,000 mcg by mouth daily.     No current facility-administered medications on file prior to visit.     ROS see history of present illness  Objective  Physical Exam Vitals:   06/09/22 1549  BP: 110/70  Pulse: 66  Temp: 97.6 F (36.4 C)  SpO2: 97%    BP Readings from Last 3 Encounters:  06/09/22 110/70  08/01/21 110/60  05/02/21 110/70   Wt Readings from Last 3 Encounters:  06/09/22 193 lb 12.8 oz (87.9 kg)  01/31/22 190 lb (86.2 kg)  08/01/21 192 lb (87.1 kg)    Physical Exam Constitutional:      Appearance: Normal appearance.  HENT:     Head: Normocephalic and atraumatic.  Cardiovascular:     Rate and Rhythm: Normal rate and regular rhythm.  Pulmonary:     Effort: Pulmonary effort is normal.     Breath sounds: Normal breath sounds.  Skin:    General: Skin is warm and dry.  Neurological:     Mental Status:  He is alert.      Assessment/Plan: Please see individual problem list.  Problem List Items Addressed This Visit       Cardiovascular and Mediastinum   Essential hypertension (Chronic)    Well-controlled. He will continue HCTZ 25 mg daily, carvedilol 12.5 mg twice daily, amlodipine-benazepril 10-40 mg daily         Endocrine   Diabetes mellitus type 2, uncomplicated (HCC) - Primary (Chronic)    Stable. A1c is 6.9, down from 7.0. Given the patient's nausea, he can take the 1mg  of semaglutide instead of the 2.0mg  dose. He will continue Jardiance 25mg  daily.       Relevant Orders   POCT HgB A1C (Completed)     Other   Nicotine dependence, cigarettes, uncomplicated    Relevant Orders   Ambulatory Referral for [place] maintenance: referral placed for lung cancer screening. Smoking cessation encouraged, patient declines at this time.   Return in about 3 months (around 09/08/2022) for htn, dm.   Marisa Cyphers, Medical Student [employer]

## 2022-06-09 NOTE — Assessment & Plan Note (Signed)
Well-controlled. He will continue HCTZ 25 mg daily, carvedilol 12.5 mg twice daily, amlodipine-benazepril 10-40 mg daily

## 2022-06-12 NOTE — Progress Notes (Signed)
Patient seen along with medical student Zada Zhong.  I personally evaluated this patient along with the student, and verified all aspects of the history, physical exam, and medical decision making as documented by the student.  I agree with the student's documentation and have made all necessary edits.  Janetta Vandoren, MD  

## 2022-07-12 ENCOUNTER — Other Ambulatory Visit: Payer: Self-pay | Admitting: Family Medicine

## 2022-07-30 ENCOUNTER — Other Ambulatory Visit: Payer: Self-pay | Admitting: Family

## 2022-07-30 DIAGNOSIS — E119 Type 2 diabetes mellitus without complications: Secondary | ICD-10-CM

## 2022-09-08 ENCOUNTER — Ambulatory Visit: Payer: BC Managed Care – PPO | Admitting: Family Medicine

## 2022-09-15 DIAGNOSIS — Z0100 Encounter for examination of eyes and vision without abnormal findings: Secondary | ICD-10-CM | POA: Diagnosis not present

## 2022-09-15 DIAGNOSIS — H2513 Age-related nuclear cataract, bilateral: Secondary | ICD-10-CM | POA: Diagnosis not present

## 2022-09-15 DIAGNOSIS — E119 Type 2 diabetes mellitus without complications: Secondary | ICD-10-CM | POA: Diagnosis not present

## 2022-09-15 LAB — HM DIABETES EYE EXAM

## 2022-09-16 ENCOUNTER — Other Ambulatory Visit: Payer: Self-pay | Admitting: Family

## 2022-10-07 ENCOUNTER — Other Ambulatory Visit: Payer: Self-pay | Admitting: Family Medicine

## 2022-10-10 ENCOUNTER — Other Ambulatory Visit: Payer: Self-pay

## 2022-10-10 MED ORDER — CARVEDILOL 12.5 MG PO TABS: 12.5000 mg | ORAL_TABLET | Freq: Two times a day (BID) | ORAL | 0 refills | Status: DC

## 2022-10-13 ENCOUNTER — Encounter: Payer: Self-pay | Admitting: Family Medicine

## 2022-10-17 MED ORDER — SEMAGLUTIDE (1 MG/DOSE) 4 MG/3ML ~~LOC~~ SOPN: 1.0000 mg | PEN_INJECTOR | SUBCUTANEOUS | 3 refills | Status: DC

## 2022-10-30 ENCOUNTER — Ambulatory Visit: Payer: BC Managed Care – PPO | Admitting: Family Medicine

## 2022-11-06 ENCOUNTER — Encounter: Payer: Self-pay | Admitting: Family Medicine

## 2022-11-06 DIAGNOSIS — E119 Type 2 diabetes mellitus without complications: Secondary | ICD-10-CM

## 2022-11-07 ENCOUNTER — Other Ambulatory Visit: Payer: Self-pay

## 2022-11-07 DIAGNOSIS — E119 Type 2 diabetes mellitus without complications: Secondary | ICD-10-CM

## 2022-11-07 MED ORDER — EMPAGLIFLOZIN 25 MG PO TABS
ORAL_TABLET | ORAL | 5 refills | Status: DC
Start: 2022-11-07 — End: 2023-05-21

## 2022-11-07 MED ORDER — AMLODIPINE BESY-BENAZEPRIL HCL 10-40 MG PO CAPS: ORAL_CAPSULE | ORAL | 5 refills | Status: DC

## 2022-11-14 ENCOUNTER — Other Ambulatory Visit: Payer: Self-pay | Admitting: Family

## 2022-11-15 ENCOUNTER — Other Ambulatory Visit: Payer: Self-pay | Admitting: Family Medicine

## 2022-11-23 ENCOUNTER — Other Ambulatory Visit: Payer: Self-pay | Admitting: Family Medicine

## 2022-11-27 DIAGNOSIS — E221 Hyperprolactinemia: Secondary | ICD-10-CM | POA: Diagnosis not present

## 2022-11-27 DIAGNOSIS — E291 Testicular hypofunction: Secondary | ICD-10-CM | POA: Diagnosis not present

## 2022-12-15 ENCOUNTER — Encounter: Payer: Self-pay | Admitting: Family Medicine

## 2022-12-15 ENCOUNTER — Ambulatory Visit: Payer: BC Managed Care – PPO | Admitting: Family Medicine

## 2022-12-15 VITALS — BP 126/80 | HR 80 | Temp 97.8°F | Ht 73.0 in | Wt 189.0 lb

## 2022-12-15 DIAGNOSIS — I10 Essential (primary) hypertension: Secondary | ICD-10-CM | POA: Diagnosis not present

## 2022-12-15 DIAGNOSIS — E785 Hyperlipidemia, unspecified: Secondary | ICD-10-CM | POA: Diagnosis not present

## 2022-12-15 DIAGNOSIS — Z7984 Long term (current) use of oral hypoglycemic drugs: Secondary | ICD-10-CM | POA: Diagnosis not present

## 2022-12-15 DIAGNOSIS — E119 Type 2 diabetes mellitus without complications: Secondary | ICD-10-CM

## 2022-12-15 LAB — LIPID PANEL
Cholesterol: 128 mg/dL (ref 0–200)
HDL: 33 mg/dL — ABNORMAL LOW (ref >39.00)
LDL Cholesterol: 78 mg/dL (ref 0–99)
NonHDL: 95.29
Total CHOL/HDL Ratio: 4
Triglycerides: 88 mg/dL (ref 0.0–149.0)
VLDL: 17.6 mg/dL (ref 0.0–40.0)

## 2022-12-15 LAB — COMPREHENSIVE METABOLIC PANEL
ALT: 18 U/L (ref 0–53)
AST: 20 U/L (ref 0–37)
Albumin: 4.4 g/dL (ref 3.5–5.2)
Alkaline Phosphatase: 88 U/L (ref 39–117)
BUN: 11 mg/dL (ref 6–23)
CO2: 28 mEq/L (ref 19–32)
Calcium: 9.8 mg/dL (ref 8.4–10.5)
Chloride: 101 mEq/L (ref 96–112)
Creatinine, Ser: 0.8 mg/dL (ref 0.40–1.50)
GFR: 95.96 mL/min (ref >60.00)
Glucose, Bld: 118 mg/dL — ABNORMAL HIGH (ref 70–99)
Potassium: 4.3 mEq/L (ref 3.5–5.1)
Sodium: 136 mEq/L (ref 135–145)
Total Bilirubin: 0.6 mg/dL (ref 0.2–1.2)
Total Protein: 7.3 g/dL (ref 6.0–8.3)

## 2022-12-15 LAB — MICROALBUMIN / CREATININE URINE RATIO
Creatinine,U: 61.5 mg/dL
Microalb Creat Ratio: 1.4 mg/g (ref 0.0–30.0)
Microalb, Ur: 0.8 mg/dL (ref 0.0–1.9)

## 2022-12-15 LAB — HEMOGLOBIN A1C: Hgb A1c MFr Bld: 7.1 % — ABNORMAL HIGH (ref 4.6–6.5)

## 2022-12-15 MED ORDER — HYDROCHLOROTHIAZIDE 25 MG PO TABS: 25.0000 mg | ORAL_TABLET | Freq: Every day | ORAL | 3 refills | Status: DC

## 2022-12-15 MED ORDER — ROSUVASTATIN CALCIUM 40 MG PO TABS: 40.0000 mg | ORAL_TABLET | Freq: Every day | ORAL | 3 refills | Status: DC

## 2022-12-15 NOTE — Assessment & Plan Note (Signed)
Controlled.  Patient will continue amlodipine-benazepril 10-40 mg daily, carvedilol 12.5 mg twice daily, and HCTZ 5 mg daily.

## 2022-12-15 NOTE — Assessment & Plan Note (Signed)
Chronic issue.  Seems to be well-controlled.  Check A1c.  Continue Jardiance 25 mg daily and Ozempic 1 mg weekly.

## 2022-12-15 NOTE — Progress Notes (Signed)
Marikay Alar, MD Phone: [phone]  Chad Frye is a 62 y.o. male who presents today for f/u.  HYPERTENSION Disease Monitoring Home BP Monitoring 107-128/69-81 Chest pain- no    Dyspnea- no Medications Compliance-  taking amlodipine, benazepril, coreg, HCTZ.  Edema- no BMET    Component Value Date/Time   NA 136 02/06/2022 1034   NA 136 02/06/2022 1034   K 4.3 02/06/2022 1034   K 4.3 02/06/2022 1034   CL 99 02/06/2022 1034   CL 99 02/06/2022 1034   CO2 28 02/06/2022 1034   CO2 28 02/06/2022 1034   GLUCOSE 105 (H) 02/06/2022 1034   GLUCOSE 105 (H) 02/06/2022 1034   BUN 14 02/06/2022 1034   BUN 14 02/06/2022 1034   BUN 12 02/13/2014 0000   CREATININE 0.86 02/06/2022 1034   CREATININE 0.86 02/06/2022 1034   CREATININE 0.92 02/15/2018 1554   CALCIUM 9.5 02/06/2022 1034   CALCIUM 9.5 02/06/2022 1034   DIABETES Disease Monitoring: Blood Sugar ranges-128, 137 Polyuria/phagia/dipsia- no      Optho- UTD Medications: Compliance- taking jardiance, ozempic Hypoglycemic symptoms- no chronic issue.  Adequately   Social History   Tobacco Use  Smoking Status Former   Current packs/day: 0.00   Types: Cigarettes   Quit date: 11/28/2015   Years since quitting: 7.0   Passive exposure: Never  Smokeless Tobacco Never    Current Outpatient Medications on File Prior to Visit  Medication Sig Dispense Refill   amLODipine-benazepril (LOTREL) 10-40 MG capsule TAKE 1 CAPSULE BY MOUTH EVERY DAY 30 capsule 5   cabergoline (DOSTINEX) 0.5 MG tablet Take by mouth.     carvedilol (COREG) 12.5 MG tablet Take 1 tablet (12.5 mg total) by mouth 2 (two) times daily with a meal. 180 tablet 0   Continuous Blood Gluc Sensor (FREESTYLE LIBRE 3 SENSOR) MISC Apply 1 each topically every 14 (fourteen) days. Place 1 sensor on the skin every 14 days. Use to check glucose continuously 2 each 11   empagliflozin (JARDIANCE) 25 MG TABS tablet TAKE 1 TABLET BY MOUTH EVERY DAY BEFORE BREAKFAST 30 tablet 5    levocetirizine (XYZAL) 5 MG tablet TAKE 1 TABLET BY MOUTH EVERY DAY IN THE EVENING 90 tablet 1   MULTIPLE VITAMIN PO Take 1 tablet by mouth daily.     Semaglutide, 1 MG/DOSE, 4 MG/3ML SOPN Inject 1 mg as directed once a week. 9 mL 3   tadalafil (CIALIS) 10 MG tablet Please specify directions, refills and quantity 10 tablet 0   Testosterone 20.25 MG/ACT (1.62%) GEL SMARTSIG:2 pump Topical Daily     vitamin B-12 (CYANOCOBALAMIN) 1000 MCG tablet Take 1,000 mcg by mouth daily. (Patient not taking: Reported on 12/15/2022)     No current facility-administered medications on file prior to visit.     ROS see history of present illness  Objective  Physical Exam Vitals:   12/15/22 1147  BP: 126/80  Pulse: 80  Temp: 97.8 F (36.6 C)  SpO2: 96%    BP Readings from Last 3 Encounters:  12/15/22 126/80  06/09/22 110/70  08/01/21 110/60   Wt Readings from Last 3 Encounters:  12/15/22 189 lb (85.7 kg)  06/09/22 193 lb 12.8 oz (87.9 kg)  01/31/22 190 lb (86.2 kg)    Physical Exam Constitutional:      General: He is not in acute distress.    Appearance: He is not diaphoretic.  Cardiovascular:     Rate and Rhythm: Normal rate and regular rhythm.  Heart sounds: Normal heart sounds.  Pulmonary:     Effort: Pulmonary effort is normal.     Breath sounds: Normal breath sounds.  Skin:    General: Skin is warm and dry.  Neurological:     Mental Status: He is alert.      Assessment/Plan: Please see individual problem list.  Essential hypertension Assessment & Plan: Controlled.  Patient will continue amlodipine-benazepril 10-40 mg daily, carvedilol 12.5 mg twice daily, and HCTZ 5 mg daily.  Orders: -     hydroCHLOROthiazide; Take 1 tablet (25 mg total) by mouth daily.  Dispense: 90 tablet; Refill: 3 -     Comprehensive metabolic panel  Hyperlipidemia, unspecified hyperlipidemia type -     Rosuvastatin Calcium; Take 1 tablet (40 mg total) by mouth daily.  Dispense: 90 tablet;  Refill: 3 -     Comprehensive metabolic panel -     Lipid panel  Type 2 diabetes mellitus without complication, without long-term current use of insulin (HCC) Assessment & Plan: Chronic issue.  Seems to be well-controlled.  Check A1c.  Continue Jardiance 25 mg daily and Ozempic 1 mg weekly.  Orders: -     Hemoglobin A1c -     Microalbumin / creatinine urine ratio     Return in about 3 months (around 03/17/2023).   Marikay Alar, MD [facility]

## 2023-01-01 ENCOUNTER — Other Ambulatory Visit: Payer: Self-pay | Admitting: *Deleted

## 2023-01-01 DIAGNOSIS — Z87891 Personal history of nicotine dependence: Secondary | ICD-10-CM

## 2023-01-01 DIAGNOSIS — Z122 Encounter for screening for malignant neoplasm of respiratory organs: Secondary | ICD-10-CM

## 2023-01-01 DIAGNOSIS — F17210 Nicotine dependence, cigarettes, uncomplicated: Secondary | ICD-10-CM

## 2023-01-08 ENCOUNTER — Other Ambulatory Visit: Payer: Self-pay | Admitting: Family Medicine

## 2023-03-19 ENCOUNTER — Encounter: Payer: Self-pay | Admitting: Family Medicine

## 2023-03-19 ENCOUNTER — Ambulatory Visit: Payer: BC Managed Care – PPO | Admitting: Family Medicine

## 2023-03-19 ENCOUNTER — Ambulatory Visit
Admission: RE | Admit: 2023-03-19 | Discharge: 2023-03-19 | Disposition: A | Discharge status: Home / Self Care | Payer: BC Managed Care – PPO | Source: Ambulatory Visit | Attending: Acute Care | Admitting: Acute Care

## 2023-03-19 ENCOUNTER — Ambulatory Visit: Payer: BC Managed Care – PPO | Admitting: Primary Care

## 2023-03-19 ENCOUNTER — Encounter: Payer: Self-pay | Admitting: Primary Care

## 2023-03-19 VITALS — BP 126/80 | HR 70 | Temp 97.8°F | Ht 73.0 in | Wt 194.2 lb

## 2023-03-19 DIAGNOSIS — Z122 Encounter for screening for malignant neoplasm of respiratory organs: Secondary | ICD-10-CM | POA: Diagnosis not present

## 2023-03-19 DIAGNOSIS — E119 Type 2 diabetes mellitus without complications: Secondary | ICD-10-CM | POA: Diagnosis not present

## 2023-03-19 DIAGNOSIS — Z87891 Personal history of nicotine dependence: Secondary | ICD-10-CM | POA: Insufficient documentation

## 2023-03-19 DIAGNOSIS — F17210 Nicotine dependence, cigarettes, uncomplicated: Secondary | ICD-10-CM | POA: Insufficient documentation

## 2023-03-19 DIAGNOSIS — E785 Hyperlipidemia, unspecified: Secondary | ICD-10-CM

## 2023-03-19 DIAGNOSIS — F17200 Nicotine dependence, unspecified, uncomplicated: Secondary | ICD-10-CM

## 2023-03-19 DIAGNOSIS — Z7984 Long term (current) use of oral hypoglycemic drugs: Secondary | ICD-10-CM | POA: Diagnosis not present

## 2023-03-19 LAB — POCT GLYCOSYLATED HEMOGLOBIN (HGB A1C): Hemoglobin A1C: 6.9 % — AB (ref 4.0–5.6)

## 2023-03-19 LAB — LDL CHOLESTEROL, DIRECT: Direct LDL: 103 mg/dL

## 2023-03-19 NOTE — Assessment & Plan Note (Signed)
Chronic issue.  Most recent LDL slightly above goal.  Recheck today.  Continue Crestor 40 mg daily.  Continue healthy diet and exercise.

## 2023-03-19 NOTE — Assessment & Plan Note (Signed)
Chronic issue.  Check A1c.  Continue Jardiance 25 mg daily and Ozempic 1 mg weekly.

## 2023-03-19 NOTE — Progress Notes (Signed)
Virtual Visit via Telephone Note  I connected with Chad Frye on 03/19/23 at  8:30 AM EDT by telephone and verified that I am speaking with the correct person using two identifiers.  Location: Patient: Home Provider: Office    I discussed the limitations, risks, security and privacy concerns of performing an evaluation and management service by telephone and the availability of in person appointments. I also discussed with the patient that there may be a patient responsible charge related to this service. The patient expressed understanding and agreed to proceed.   Shared Decision Making Visit Lung Cancer Screening Program [id number])   Eligibility: Age 62 y.o. Pack Years Smoking History Calculation 40 years x 1.5ppd (60 pack year hx) (# packs/per year x # years smoked) Recent History of coughing up blood  no Unexplained weight loss? no ( >Than 15 pounds within the last 6 months ) Prior History Lung / other cancer no (Diagnosis within the last 5 years already requiring surveillance chest CT Scans). Smoking Status Current Smoker Former Smokers: Years since quit: Na  Quit Date: NA  Visit Components: Discussion included one or more decision making aids. yes Discussion included risk/benefits of screening. yes Discussion included potential follow up diagnostic testing for abnormal scans. yes Discussion included meaning and risk of over diagnosis. yes Discussion included meaning and risk of False Positives. yes Discussion included meaning of total radiation exposure. yes  Counseling Included: Importance of adherence to annual lung cancer LDCT screening. yes Impact of comorbidities on ability to participate in the program. yes Ability and willingness to under diagnostic treatment. yes  Smoking Cessation Counseling: Current Smokers:  Discussed importance of smoking cessation. yes Information about tobacco cessation classes and interventions provided to patient. yes Patient  provided with "ticket" for LDCT Scan. NA Symptomatic Patient. no  Counseling(Intermediate counseling: > three minutes) 99406 Diagnosis Code: Tobacco Use Z72.0 Asymptomatic Patient yes  Counseling (Intermediate counseling: > three minutes counseling) [id number] Former Smokers:  Discussed the importance of maintaining cigarette abstinence. yes Diagnosis Code: Personal History of Nicotine Dependence. W29.562 Information about tobacco cessation classes and interventions provided to patient. Yes Patient provided with "ticket" for LDCT Scan. NA Written Order for Lung Cancer Screening with LDCT placed in Epic. Yes (CT Chest Lung Cancer Screening Low Dose W/O CM) [id number] Z12.2-Screening of respiratory organs Z87.891-Personal history of nicotine dependence   I have spent 25 minutes of face to face/ virtual visit   time with Chad Frye discussing the risks and benefits of lung cancer screening. We viewed / discussed a power point together that explained in detail the above noted topics. We paused at intervals to allow for questions to be asked and answered to ensure understanding.We discussed that the single most powerful action that he can take to decrease her risk of developing lung cancer is to quit smoking. We discussed whether or not he is ready to commit to setting a quit date. We discussed options for tools to aid in quitting smoking including nicotine replacement therapy, non-nicotine medications, support groups, Quit Smart classes, and behavior modification. We discussed that often times setting smaller, more achievable goals, such as eliminating 1 cigarette a day for a week and then 2 cigarettes a day for a week can be helpful in slowly decreasing the number of cigarettes smoked. This allows for a sense of accomplishment as well as providing a clinical benefit. I provided him  with smoking cessation  information  with contact information for community resources, classes, free nicotine replacement therapy,  and access to mobile apps, text messaging, and on-line smoking cessation help. I have also provided  him  the office contact information in the event he needs to contact me, or the screening staff. We discussed the time and location of the scan, and that either Abigail Miyamoto RN, Karlton Lemon, RN  or I will call / send a letter with the results within 24-72 hours of receiving them. The patient verbalized understanding of all of  the above and had no further questions upon leaving the office. They have my contact information in the event they have any further questions.  I spent 3-5 minutes counseling on smoking cessation and the health risks of continued tobacco abuse.  I explained to the patient that there has been a high incidence of coronary artery disease noted on these exams. I explained that this is a non-gated exam therefore degree or severity cannot be determined. This patient is on statin therapy. I have asked the patient to follow-up with their PCP regarding any incidental finding of coronary artery disease and management with diet or medication as their PCP  feels is clinically indicated. The patient verbalized understanding of the above and had no further questions upon completion of the visit.   Glenford Bayley, NP

## 2023-03-19 NOTE — Progress Notes (Signed)
Marikay Alar, MD Phone: [phone]  Chad Frye is a 62 y.o. male who presents today for f/u.  DIABETES Disease Monitoring: Blood Sugar ranges-not checking Polyuria/phagia/dipsia- no      Optho- UTD Medications: Compliance- taking jardiance, ozempic Hypoglycemic symptoms- no  HYPERLIPIDEMIA Symptoms Chest pain on exertion:  no   Medications: Compliance- taking crestor Right upper quadrant pain- no  Muscle aches- no Eats 2 times a day when he is at home.  Does snack.  Has fruits and vegetables.  Has sweet tea with Splenda.  Does occasionally have a soda.  Walks 10 to 30 minutes daily. Lipid Panel     Component Value Date/Time   CHOL 128 12/15/2022 1211   TRIG 88.0 12/15/2022 1211   HDL 33.00 (L) 12/15/2022 1211   CHOLHDL 4 12/15/2022 1211   VLDL 17.6 12/15/2022 1211   LDLCALC 78 12/15/2022 1211   LDLCALC 113 (H) 07/27/2017 1601   LDLDIRECT 171.0 11/26/2018 0837      Social History   Tobacco Use  Smoking Status Every Day   Current packs/day: 0.50   Average packs/day: 0.5 packs/day for 5.8 years (2.9 ttl pk-yrs)   Types: Cigarettes   Start date: 05/23/2017   Last attempt to quit: 11/28/2015   Passive exposure: Never  Smokeless Tobacco Never    Current Outpatient Medications on File Prior to Visit  Medication Sig Dispense Refill   amLODipine-benazepril (LOTREL) 10-40 MG capsule TAKE 1 CAPSULE BY MOUTH EVERY DAY 30 capsule 5   cabergoline (DOSTINEX) 0.5 MG tablet Take by mouth.     carvedilol (COREG) 12.5 MG tablet TAKE 1 TABLET (12.5MG  TOTAL) BY MOUTH TWICE A DAY WITH MEALS 180 tablet 0   Continuous Blood Gluc Sensor (FREESTYLE LIBRE 3 SENSOR) MISC Apply 1 each topically every 14 (fourteen) days. Place 1 sensor on the skin every 14 days. Use to check glucose continuously 2 each 11   empagliflozin (JARDIANCE) 25 MG TABS tablet TAKE 1 TABLET BY MOUTH EVERY DAY BEFORE BREAKFAST 30 tablet 5   hydrochlorothiazide (HYDRODIURIL) 25 MG tablet Take 1 tablet (25 mg total)  by mouth daily. 90 tablet 3   levocetirizine (XYZAL) 5 MG tablet TAKE 1 TABLET BY MOUTH EVERY DAY IN THE EVENING 90 tablet 1   MULTIPLE VITAMIN PO Take 1 tablet by mouth daily.     rosuvastatin (CRESTOR) 40 MG tablet Take 1 tablet (40 mg total) by mouth daily. 90 tablet 3   Semaglutide, 1 MG/DOSE, 4 MG/3ML SOPN Inject 1 mg as directed once a week. 9 mL 3   tadalafil (CIALIS) 10 MG tablet Please specify directions, refills and quantity 10 tablet 0   Testosterone 20.25 MG/ACT (1.62%) GEL SMARTSIG:2 pump Topical Daily     vitamin B-12 (CYANOCOBALAMIN) 1000 MCG tablet Take 1,000 mcg by mouth daily.     No current facility-administered medications on file prior to visit.     ROS see history of present illness  Objective  Physical Exam Vitals:   03/19/23 1041  BP: 126/80  Pulse: 70  Temp: 97.8 F (36.6 C)  SpO2: 96%    BP Readings from Last 3 Encounters:  03/19/23 126/80  12/15/22 126/80  06/09/22 110/70   Wt Readings from Last 3 Encounters:  03/19/23 194 lb 3.2 oz (88.1 kg)  03/19/23 189 lb (85.7 kg)  12/15/22 189 lb (85.7 kg)    Physical Exam Constitutional:      General: He is not in acute distress.    Appearance: He is not diaphoretic.  Cardiovascular:     Rate and Rhythm: Normal rate and regular rhythm.     Heart sounds: Normal heart sounds.  Pulmonary:     Effort: Pulmonary effort is normal.     Breath sounds: Normal breath sounds.  Skin:    General: Skin is warm and dry.  Neurological:     Mental Status: He is alert.      Assessment/Plan: Please see individual problem list.  Type 2 diabetes mellitus without complication, without long-term current use of insulin (HCC) Assessment & Plan: Chronic issue.  Check A1c.  Continue Jardiance 25 mg daily and Ozempic 1 mg weekly.  Orders: -     POCT glycosylated hemoglobin (Hb A1C)  Hyperlipidemia, unspecified hyperlipidemia type Assessment & Plan: Chronic issue.  Most recent LDL slightly above goal.  Recheck  today.  Continue Crestor 40 mg daily.  Continue healthy diet and exercise.  Orders: -     LDL cholesterol, direct    Patient did want his Crestor and Jardiance sent to [facility] as this would provide cost savings.  It appears this is a pharmacy in [place].  I was not able to find this in our system.  Patient was advised to contact them to figure out what needs to be done to get his prescriptions.  Return in about 6 months (around 09/17/2023) for transfer of care.   Marikay Alar, MD [facility]

## 2023-03-19 NOTE — Patient Instructions (Signed)

## 2023-03-22 ENCOUNTER — Other Ambulatory Visit: Payer: Self-pay | Admitting: Family Medicine

## 2023-03-22 DIAGNOSIS — E7800 Pure hypercholesterolemia, unspecified: Secondary | ICD-10-CM

## 2023-03-22 MED ORDER — REPATHA SURECLICK 140 MG/ML ~~LOC~~ SOAJ: 140.0000 mg | SUBCUTANEOUS | 2 refills | Status: DC

## 2023-03-28 ENCOUNTER — Encounter: Payer: Self-pay | Admitting: Family Medicine

## 2023-03-29 ENCOUNTER — Other Ambulatory Visit: Payer: Self-pay | Admitting: Family

## 2023-03-29 MED ORDER — NYSTATIN 100000 UNIT/GM EX POWD: 1.0000 | Freq: Three times a day (TID) | CUTANEOUS | 0 refills | Status: DC

## 2023-04-11 ENCOUNTER — Other Ambulatory Visit: Payer: Self-pay | Admitting: Family Medicine

## 2023-04-13 ENCOUNTER — Other Ambulatory Visit: Payer: Self-pay | Admitting: Family Medicine

## 2023-04-25 ENCOUNTER — Telehealth: Payer: Self-pay | Admitting: *Deleted

## 2023-04-25 ENCOUNTER — Telehealth: Payer: Self-pay | Admitting: Family Medicine

## 2023-04-25 DIAGNOSIS — I359 Nonrheumatic aortic valve disorder, unspecified: Secondary | ICD-10-CM

## 2023-04-25 DIAGNOSIS — R911 Solitary pulmonary nodule: Secondary | ICD-10-CM

## 2023-04-25 DIAGNOSIS — Z87891 Personal history of nicotine dependence: Secondary | ICD-10-CM

## 2023-04-25 NOTE — Telephone Encounter (Signed)
-----   Message from Nurse Wyvonnia Dusky sent at 04/25/2023  9:18 AM EST ----- We will repeat CT in 6 months Also see recommendation for Echo

## 2023-04-25 NOTE — Telephone Encounter (Signed)
Spoke with patient regarding lung screening CT results. Small nodules noted that we would like to look at again in 6 months with a repeat CT. Also noted was coronary calcifications with recommendation for an echo. Pt states he doesn't think he has ever had one and has not seen a cardiologist. I advised pt that I would send a note to Dr Birdie Sons to let him advise on this. Pt verbalized understanding. Results/ plans faxed to PCP. Order placed for 6 mth nodule f/u CT.

## 2023-04-25 NOTE — Telephone Encounter (Signed)
Please let the patient know that there is mention of possible calcification in his aortic valve on his recent CT scan.  I would suggest we go ahead and get an echo to determine if this is truly an issue.  I can place that order when she speak with him.  Thanks.

## 2023-04-26 NOTE — Telephone Encounter (Signed)
Patient is agreeable to the echo.

## 2023-04-26 NOTE — Telephone Encounter (Signed)
Echo ordered.

## 2023-04-26 NOTE — Addendum Note (Signed)
Addended by: Birdie Sons, Jordanna Hendrie G on: 04/26/2023 01:15 PM   Modules accepted: Orders

## 2023-05-11 ENCOUNTER — Other Ambulatory Visit: Payer: BC Managed Care – PPO

## 2023-05-18 ENCOUNTER — Other Ambulatory Visit: Payer: BC Managed Care – PPO

## 2023-05-19 ENCOUNTER — Other Ambulatory Visit: Payer: Self-pay | Admitting: Family Medicine

## 2023-05-19 DIAGNOSIS — E119 Type 2 diabetes mellitus without complications: Secondary | ICD-10-CM

## 2023-05-21 ENCOUNTER — Other Ambulatory Visit (INDEPENDENT_AMBULATORY_CARE_PROVIDER_SITE_OTHER): Payer: BC Managed Care – PPO

## 2023-05-21 DIAGNOSIS — E7800 Pure hypercholesterolemia, unspecified: Secondary | ICD-10-CM | POA: Diagnosis not present

## 2023-05-21 LAB — HEPATIC FUNCTION PANEL
ALT: 24 U/L (ref 0–53)
AST: 17 U/L (ref 0–37)
Albumin: 4.4 g/dL (ref 3.5–5.2)
Alkaline Phosphatase: 101 U/L (ref 39–117)
Bilirubin, Direct: 0.1 mg/dL (ref 0.0–0.3)
Total Bilirubin: 0.5 mg/dL (ref 0.2–1.2)
Total Protein: 7.3 g/dL (ref 6.0–8.3)

## 2023-05-21 LAB — LDL CHOLESTEROL, DIRECT: Direct LDL: 49 mg/dL

## 2023-06-11 ENCOUNTER — Ambulatory Visit
Admission: RE | Admit: 2023-06-11 | Discharge: 2023-06-11 | Disposition: A | Discharge status: Home / Self Care | Payer: BC Managed Care – PPO | Source: Ambulatory Visit | Attending: Family Medicine | Admitting: Family Medicine

## 2023-06-11 DIAGNOSIS — E785 Hyperlipidemia, unspecified: Secondary | ICD-10-CM | POA: Diagnosis not present

## 2023-06-11 DIAGNOSIS — I10 Essential (primary) hypertension: Secondary | ICD-10-CM | POA: Diagnosis not present

## 2023-06-11 DIAGNOSIS — E119 Type 2 diabetes mellitus without complications: Secondary | ICD-10-CM | POA: Insufficient documentation

## 2023-06-11 DIAGNOSIS — I359 Nonrheumatic aortic valve disorder, unspecified: Secondary | ICD-10-CM | POA: Insufficient documentation

## 2023-06-11 LAB — ECHOCARDIOGRAM COMPLETE
AR max vel: 2.73 cm2
AV Area VTI: 3.27 cm2
AV Area mean vel: 2.96 cm2
AV Mean grad: 3 mm[Hg]
AV Peak grad: 4.7 mm[Hg]
Ao pk vel: 1.09 m/s
Area-P 1/2: 4.04 cm2
Calc EF: 45.9 %
MV VTI: 2.24 cm2
S' Lateral: 3.6 cm
Single Plane A2C EF: 47.8 %
Single Plane A4C EF: 46.2 %

## 2023-06-11 NOTE — Progress Notes (Signed)
*  PRELIMINARY RESULTS* Echocardiogram 2D Echocardiogram has been performed.  Chad Frye 06/11/2023, 9:26 AM

## 2023-06-20 ENCOUNTER — Other Ambulatory Visit: Payer: Self-pay | Admitting: Family Medicine

## 2023-06-20 DIAGNOSIS — E7800 Pure hypercholesterolemia, unspecified: Secondary | ICD-10-CM

## 2023-07-12 ENCOUNTER — Other Ambulatory Visit: Payer: Self-pay | Admitting: Family Medicine

## 2023-08-14 ENCOUNTER — Other Ambulatory Visit: Payer: Self-pay

## 2023-08-14 DIAGNOSIS — E7800 Pure hypercholesterolemia, unspecified: Secondary | ICD-10-CM

## 2023-08-14 MED ORDER — REPATHA SURECLICK 140 MG/ML ~~LOC~~ SOAJ: 140.0000 mg | SUBCUTANEOUS | 1 refills | Status: DC

## 2023-09-14 ENCOUNTER — Other Ambulatory Visit: Payer: Self-pay | Admitting: Nurse Practitioner

## 2023-09-18 ENCOUNTER — Ambulatory Visit: Payer: BC Managed Care – PPO | Admitting: Nurse Practitioner

## 2023-09-18 ENCOUNTER — Ambulatory Visit

## 2023-09-18 ENCOUNTER — Encounter: Payer: Self-pay | Admitting: Nurse Practitioner

## 2023-09-18 VITALS — BP 110/66 | HR 62 | Temp 98.0°F | Ht 73.0 in | Wt 201.0 lb

## 2023-09-18 DIAGNOSIS — M1711 Unilateral primary osteoarthritis, right knee: Secondary | ICD-10-CM | POA: Diagnosis not present

## 2023-09-18 DIAGNOSIS — H2513 Age-related nuclear cataract, bilateral: Secondary | ICD-10-CM | POA: Diagnosis not present

## 2023-09-18 DIAGNOSIS — E7800 Pure hypercholesterolemia, unspecified: Secondary | ICD-10-CM

## 2023-09-18 DIAGNOSIS — M25561 Pain in right knee: Secondary | ICD-10-CM

## 2023-09-18 DIAGNOSIS — G8929 Other chronic pain: Secondary | ICD-10-CM

## 2023-09-18 DIAGNOSIS — R0683 Snoring: Secondary | ICD-10-CM | POA: Insufficient documentation

## 2023-09-18 DIAGNOSIS — E119 Type 2 diabetes mellitus without complications: Secondary | ICD-10-CM

## 2023-09-18 DIAGNOSIS — Z7985 Long-term (current) use of injectable non-insulin antidiabetic drugs: Secondary | ICD-10-CM

## 2023-09-18 DIAGNOSIS — I10 Essential (primary) hypertension: Secondary | ICD-10-CM | POA: Diagnosis not present

## 2023-09-18 DIAGNOSIS — H25041 Posterior subcapsular polar age-related cataract, right eye: Secondary | ICD-10-CM | POA: Diagnosis not present

## 2023-09-18 LAB — HEMOGLOBIN A1C: Hgb A1c MFr Bld: 7.9 % — ABNORMAL HIGH (ref 4.6–6.5)

## 2023-09-18 LAB — COMPREHENSIVE METABOLIC PANEL WITH GFR
ALT: 22 U/L (ref 0–53)
AST: 18 U/L (ref 0–37)
Albumin: 4.5 g/dL (ref 3.5–5.2)
Alkaline Phosphatase: 85 U/L (ref 39–117)
BUN: 13 mg/dL (ref 6–23)
CO2: 29 meq/L (ref 19–32)
Calcium: 9.6 mg/dL (ref 8.4–10.5)
Chloride: 98 meq/L (ref 96–112)
Creatinine, Ser: 0.72 mg/dL (ref 0.40–1.50)
GFR: 98.53 mL/min (ref >60.00)
Glucose, Bld: 136 mg/dL — ABNORMAL HIGH (ref 70–99)
Potassium: 4.5 meq/L (ref 3.5–5.1)
Sodium: 135 meq/L (ref 135–145)
Total Bilirubin: 0.6 mg/dL (ref 0.2–1.2)
Total Protein: 7.1 g/dL (ref 6.0–8.3)

## 2023-09-18 LAB — LIPID PANEL
Cholesterol: 88 mg/dL (ref 0–200)
HDL: 34.1 mg/dL — ABNORMAL LOW (ref >39.00)
LDL Cholesterol: 29 mg/dL (ref 0–99)
NonHDL: 54.07
Total CHOL/HDL Ratio: 3
Triglycerides: 127 mg/dL (ref 0.0–149.0)
VLDL: 25.4 mg/dL (ref 0.0–40.0)

## 2023-09-18 MED ORDER — OZEMPIC (2 MG/DOSE) 8 MG/3ML ~~LOC~~ SOPN: 2.0000 mg | PEN_INJECTOR | SUBCUTANEOUS | 5 refills | Status: DC

## 2023-09-18 NOTE — Assessment & Plan Note (Signed)
 He is on Repatha  and Crestor , with previous LDL levels in the 40s. The goal is to maintain LDL under 70 mg/dL. Check cholesterol levels today and evaluate the necessity of continuing both Repatha  and Crestor  based on LDL results.

## 2023-09-18 NOTE — Assessment & Plan Note (Addendum)
 He has chronic knee pain, likely due to osteoarthritis, exacerbated by activity. It is managed with glucosamine and occasional NSAIDs. Order x-ray of right knee for further evaluation. Continue NSAIDs like ibuprofen or naproxen for pain management as needed. Discuss potential prescription NSAIDs if over-the-counter options are insufficient. Consider referral to ortho pending x-ray results.

## 2023-09-18 NOTE — Progress Notes (Signed)
 Chad Burkitt, NP-C Phone: [phone]  Chad Frye is a 63 y.o. male who presents today for transfer of care.   Discussed the use of AI scribe software for clinical note transcription with the patient, who gave verbal consent to proceed.  History of Present Illness   Chad Frye "Chad Frye" is a 63 year old male with type 2 diabetes and hypertension who presents for transfer of care.  He is experiencing side effects from Jardiance , specifically a genital yeast infection, for which he has been treated with nystatin  powder and cream. He wishes to discontinue Jardiance  due to these side effects and prefers to continue with Ozempic , which he is currently taking at a dose of 1 mg weekly. His last A1c was 6.9% six months ago, indicating controlled diabetes.  He is on multiple medications for hypertension, including hydrochlorothiazide , Coreg  (carvedilol ), and a combination of amlodipine  and benazepril . His blood pressure is reported as 110/66 mmHg. During a DOT physical two weeks ago, a sleep study was suggested due to the number of blood pressure medications he is on. He feels sleepy during the day, not always well-rested upon waking, and sometimes snores, which his wife confirms.  He is also on Repatha  and Crestor  (rosuvastatin ) for cholesterol management. He questions the necessity of taking both medications and is awaiting cholesterol test results. He recently had an eye exam, and the report is expected to be sent over.  He reports knee pain, particularly in the right knee, which worsens with activities such as climbing stairs or inclines. He takes glucosamine daily and occasionally uses naproxen for pain relief. He has not seen an orthopedic specialist for his knee issues.  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 He mentions having to potentially take time off for a sleep study. No chest pain, shortness of breath, dizziness, swelling,  excessive thirst, or excessive urination. He reports muscle aches, primarily in his back and knees, and sometimes feels sleepy during the day.   Social History   Tobacco Use  Smoking Status Every Day   Current packs/day: 0.50   Average packs/day: 0.5 packs/day for 6.3 years (3.2 ttl pk-yrs)   Types: Cigarettes   Start date: 05/23/2017   Last attempt to quit: 11/28/2015   Passive exposure: Never  Smokeless Tobacco Never    Current Outpatient Medications on File Prior to Visit  Medication Sig Dispense Refill   amLODipine -benazepril  (LOTREL) 10-40 MG capsule TAKE 1 CAPSULE BY MOUTH EVERY DAY 90 capsule 3   cabergoline (DOSTINEX) 0.5 MG tablet Take by mouth.     carvedilol  (COREG ) 12.5 MG tablet TAKE 1 TABLET (12.5MG  TOTAL) BY MOUTH TWICE A DAY WITH MEALS 180 tablet 0   Evolocumab  (REPATHA  SURECLICK) 140 MG/ML SOAJ Inject 140 mg into the skin every 14 (fourteen) days. 2 mL 1   hydrochlorothiazide  (HYDRODIURIL ) 25 MG tablet Take 1 tablet (25 mg total) by mouth daily. 90 tablet 3   levocetirizine (XYZAL ) 5 MG tablet TAKE 1 TABLET BY MOUTH EVERY DAY IN THE EVENING 90 tablet 1   MULTIPLE VITAMIN PO Take 1 tablet by mouth daily.     rosuvastatin  (CRESTOR ) 40 MG tablet Take 1 tablet (40 mg total) by mouth daily. 90 tablet 3   tadalafil (CIALIS) 10 MG tablet Please specify directions, refills and quantity 10 tablet 0   Testosterone  20.25 MG/ACT (1.62%) GEL SMARTSIG:2 pump Topical Daily     No current facility-administered medications on file prior  to visit.     ROS see history of present illness  Objective  Physical Exam Vitals:   09/18/23 1001  BP: 110/66  Pulse: 62  Temp: 98 F (36.7 C)  SpO2: 95%    BP Readings from Last 3 Encounters:  09/18/23 110/66  03/19/23 126/80  12/15/22 126/80   Wt Readings from Last 3 Encounters:  09/18/23 201 lb (91.2 kg)  03/19/23 189 lb (85.7 kg)  03/19/23 194 lb 3.2 oz (88.1 kg)    Physical Exam Constitutional:      General: He is not in  acute distress.    Appearance: Normal appearance.  HENT:     Head: Normocephalic.  Cardiovascular:     Rate and Rhythm: Normal rate and regular rhythm.     Heart sounds: Normal heart sounds.  Pulmonary:     Effort: Pulmonary effort is normal.     Breath sounds: Normal breath sounds.  Musculoskeletal:     Right knee: No swelling or crepitus. Normal range of motion. No tenderness.  Skin:    General: Skin is warm and dry.  Neurological:     General: No focal deficit present.     Mental Status: He is alert.  Psychiatric:        Mood and Affect: Mood normal.        Behavior: Behavior normal.      Assessment/Plan: Please see individual problem list.  Chronic pain of right knee Assessment & Plan: He has chronic knee pain, likely due to osteoarthritis, exacerbated by activity. It is managed with glucosamine and occasional NSAIDs. Order x-ray of right knee for further evaluation. Continue NSAIDs like ibuprofen or naproxen for pain management as needed. Discuss potential prescription NSAIDs if over-the-counter options are insufficient. Consider referral to ortho pending x-ray results.   Orders: -     DG Knee 1-2 Views Right; Future  Snoring Assessment & Plan: He is suspected to have sleep apnea due to daytime sleepiness, non-restorative sleep, and snoring. Refer to pulmonology for a home sleep study.   Orders: -     Pulmonary Visit  Type 2 diabetes mellitus without complication, without long-term current use of insulin (HCC) Assessment & Plan: His diabetes is well-controlled with an A1c of 6.9%. He is experiencing side effects from Jardiance  and prefers to discontinue it. He is currently on Ozempic  1 mg. Discontinue Jardiance  and increase the Ozempic  dosage. Check A1c today. Encourage healthy diet and exercise as tolerated.   Orders: -     Ozempic  (2 MG/DOSE); Inject 2 mg into the skin once a week.  Dispense: 3 mL; Refill: 5 -     Hemoglobin A1c  Essential  hypertension Assessment & Plan: His blood pressure is well-controlled at 110/66 mmHg on the current regimen. Continue Coreg , hydrochlorothiazide , amlodipine  and benazepril .   Orders: -     Comprehensive metabolic panel with GFR  Pure hypercholesterolemia Assessment & Plan: He is on Repatha  and Crestor , with previous LDL levels in the 40s. The goal is to maintain LDL under 70 mg/dL. Check cholesterol levels today and evaluate the necessity of continuing both Repatha  and Crestor  based on LDL results.  Orders: -     Lipid panel     Return in about 4 months (around 01/18/2024) for Follow up.   Chad Burkitt, NP-C [facility]

## 2023-09-18 NOTE — Assessment & Plan Note (Signed)
 He is suspected to have sleep apnea due to daytime sleepiness, non-restorative sleep, and snoring. Refer to pulmonology for a home sleep study.

## 2023-09-18 NOTE — Assessment & Plan Note (Signed)
 His diabetes is well-controlled with an A1c of 6.9%. He is experiencing side effects from Jardiance  and prefers to discontinue it. He is currently on Ozempic  1 mg. Discontinue Jardiance  and increase the Ozempic  dosage. Check A1c today. Encourage healthy diet and exercise as tolerated.

## 2023-09-18 NOTE — Assessment & Plan Note (Signed)
 His blood pressure is well-controlled at 110/66 mmHg on the current regimen. Continue Coreg , hydrochlorothiazide , amlodipine  and benazepril .

## 2023-09-19 ENCOUNTER — Telehealth: Payer: Self-pay

## 2023-09-19 NOTE — Telephone Encounter (Signed)
 Copied from CRM [id number]. Topic: Referral - Question >> Sep 19, 2023  3:39 PM Caliyah H wrote: Reason for CRM: Melissa from [facility] called regarding the patient's referral. She stated that while the referral was received, it was not directed to the correct department. In order to proceed with scheduling the patient's appointment, the referral needs to be directed to [facility]. She mentioned that it does not need to be faxed, as all departments are within the [facility] system.  Callback Number: [phone]

## 2023-09-25 NOTE — Telephone Encounter (Signed)
 Noted.

## 2023-09-26 ENCOUNTER — Ambulatory Visit

## 2023-10-12 ENCOUNTER — Other Ambulatory Visit: Payer: Self-pay | Admitting: Nurse Practitioner

## 2023-10-12 DIAGNOSIS — E7800 Pure hypercholesterolemia, unspecified: Secondary | ICD-10-CM

## 2023-10-15 ENCOUNTER — Other Ambulatory Visit: Payer: Self-pay | Admitting: Nurse Practitioner

## 2023-10-16 ENCOUNTER — Telehealth: Payer: Self-pay

## 2023-10-16 MED ORDER — LEVOCETIRIZINE DIHYDROCHLORIDE 5 MG PO TABS: 5.0000 mg | ORAL_TABLET | Freq: Every evening | ORAL | 1 refills | Status: DC

## 2023-10-16 NOTE — Telephone Encounter (Signed)
 Chad Frye

## 2023-10-21 IMAGING — MR MR HEAD W/O CM
11 of 14 series · 31 of 48 positions shown · non-contrast
Comparison: None.

CLINICAL DATA: Hypogonadism

EXAM:
MRI HEAD WITHOUT CONTRAST
TECHNIQUE: Multiplanar, multiecho pulse sequences of the brain and surrounding
structures were obtained without intravenous contrast.

[Series 5: ax dwi_tracew · axial · 3.0mm · 0.65mm/px · z∈[-88,+66]mm · 5 of 48 slices shown]
[im 1/48]
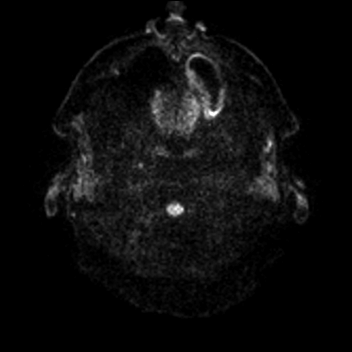
[im 12/48]
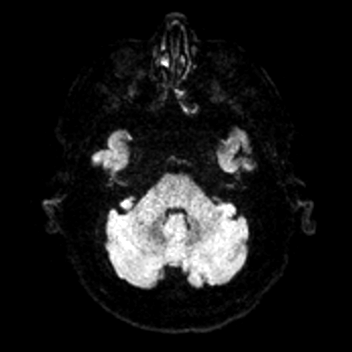
[im 24/48]
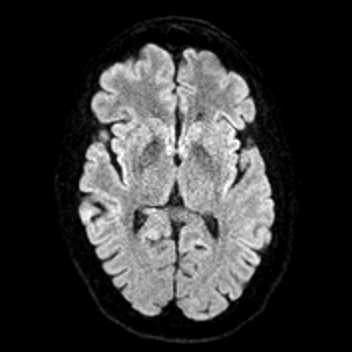
[im 36/48]
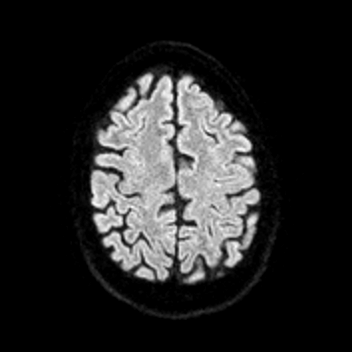
[im 48/48]
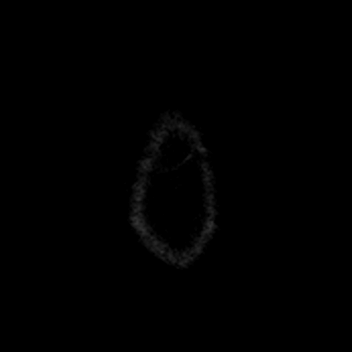

[Series 6: ax dwi_adc · axial · 3.0mm · 0.65mm/px · z∈[-88,+66]mm · 4 of 48 slices shown]
[im 1/48]
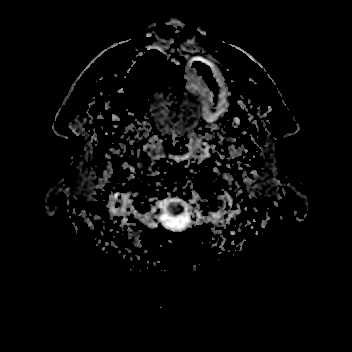
[im 16/48]
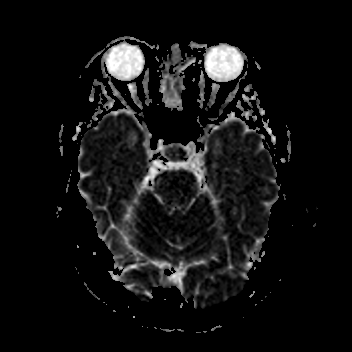
[im 32/48]
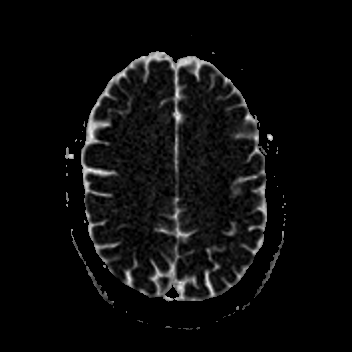
[im 48/48]
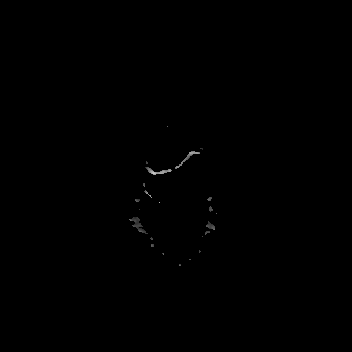

[Series 7: cor dwi_tracew · coronal · 5.0mm · 0.60mm/px · 1 of 40 slices shown]
[im 1/40]
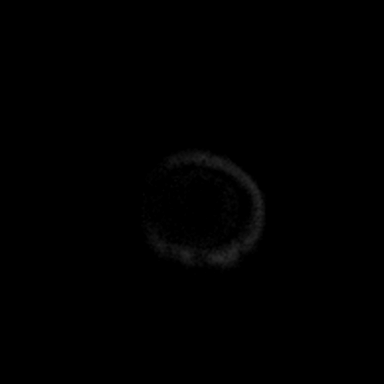

[Series 9: T1 · sagittal · 5.0mm · 0.62mm/px · 2 of 23 slices shown (1 of 4)]
[im 1/23]
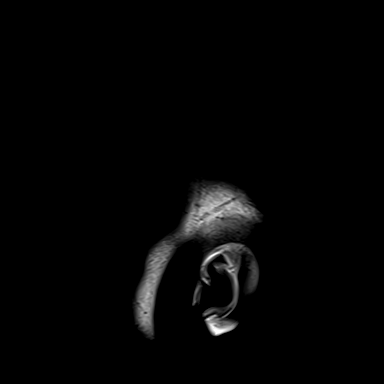
[im 23/23]
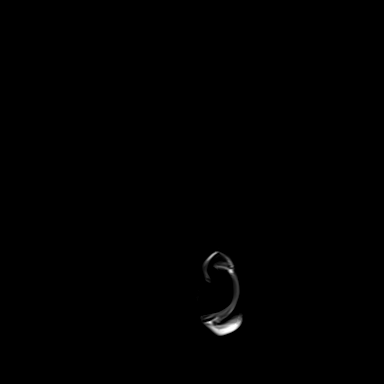

[Series 10: T2 · axial · 5.0mm · 0.53mm/px · z∈[-89,+66]mm · 2 of 27 slices shown (1 of 3)]
[im 1/27]
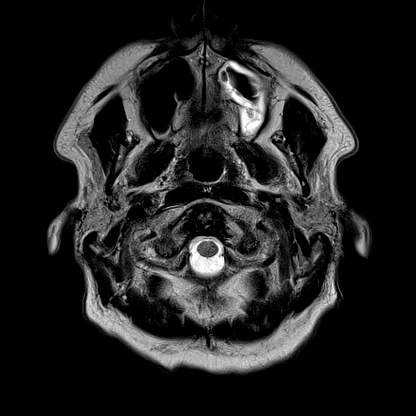
[im 27/27]
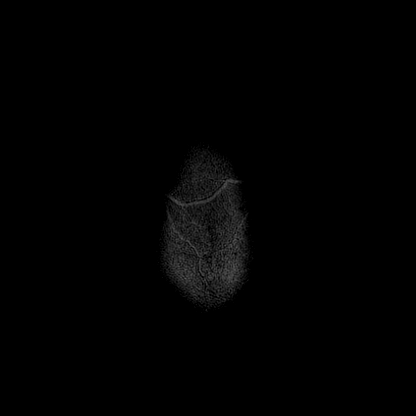

[Series 15: FLAIR · axial · 3.0mm · 0.53mm/px · z∈[-92,+69]mm · 4 of 55 slices shown]
[im 1/55]
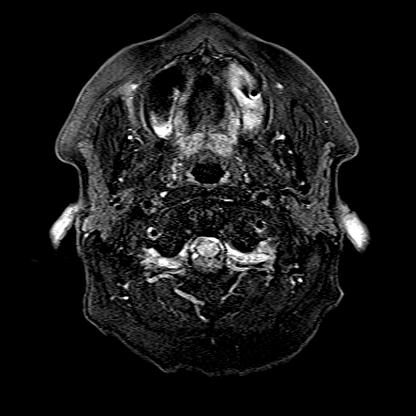
[im 19/55]
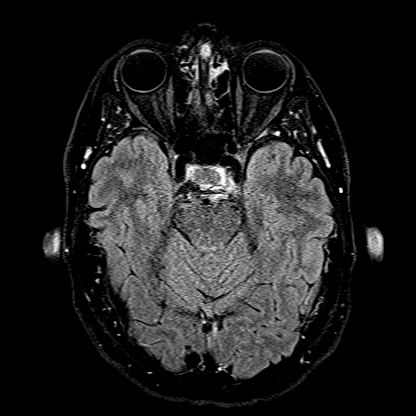
[im 37/55]
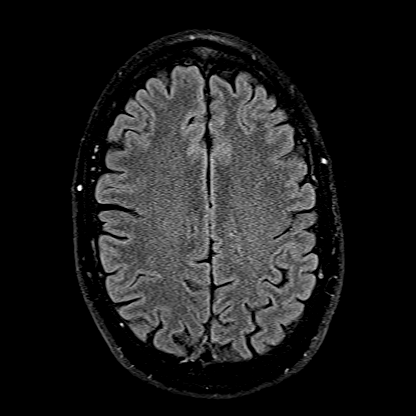
[im 55/55]
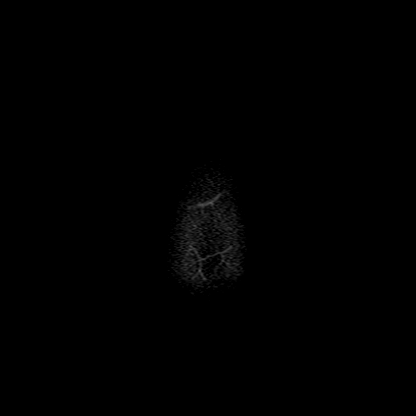

[Series 16: T1 · axial · 1.0mm · 0.98mm/px · z∈[-89,+69]mm · 8 of 160 slices shown (2 of 4)]
[im 1/160]
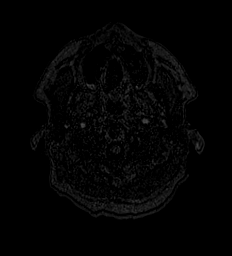
[im 29/160]
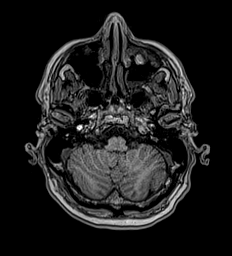
[im 44/160]
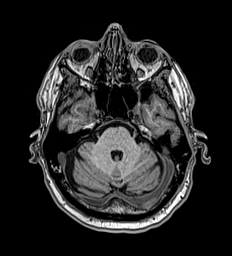
[im 73/160]
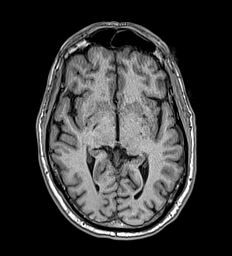
[im 87/160]
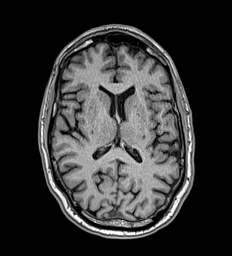
[im 116/160]
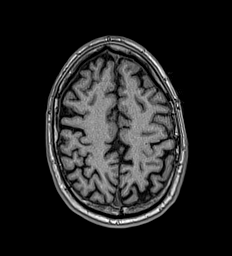
[im 131/160]
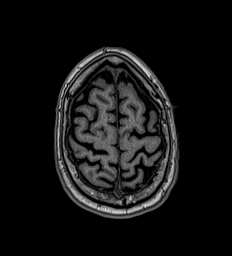
[im 160/160]
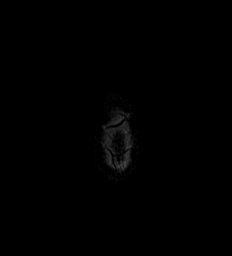

[Series 17: T2 · coronal · 5.0mm · 0.57mm/px · 2 of 30 slices shown (2 of 3)]
[im 1/30]
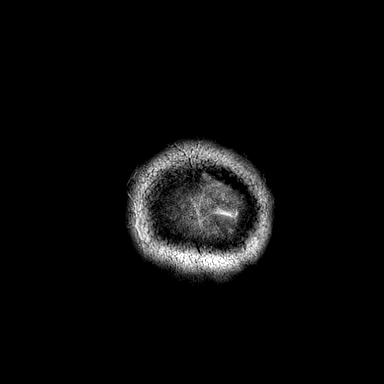
[im 30/30]
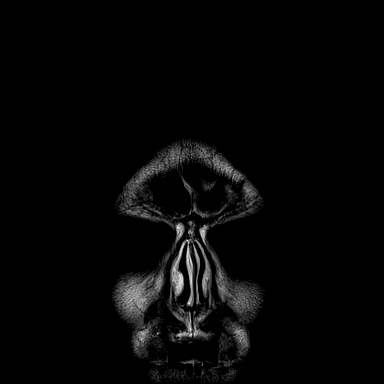

[Series 18: T1 · sagittal · 3.0mm · 0.21mm/px · 1 of 13 slices shown (3 of 4)]
[im 1/13]
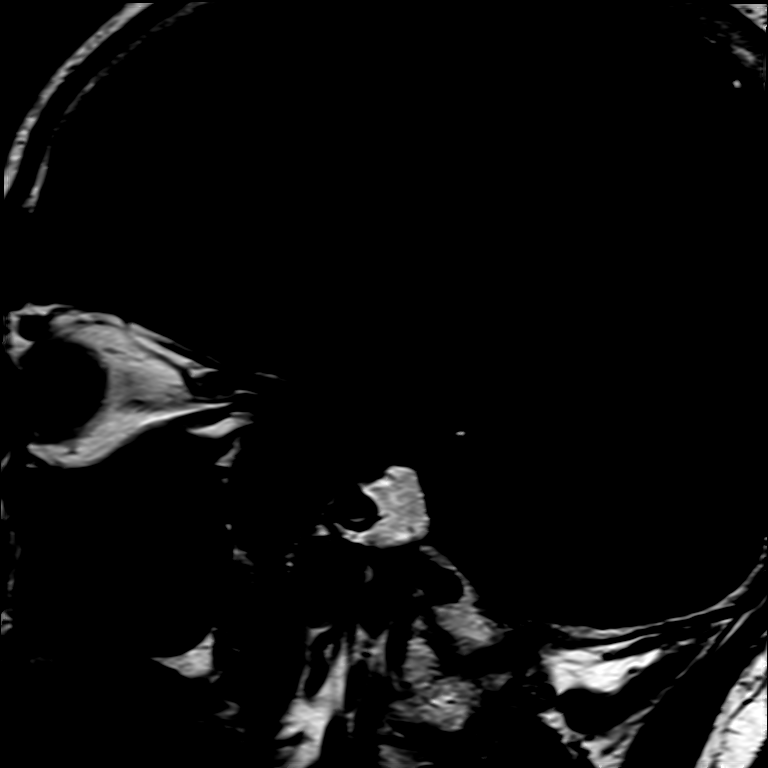

[Series 19: T1 · coronal · 3.0mm · 0.21mm/px · 1 of 13 slices shown (4 of 4)]
[im 1/13]
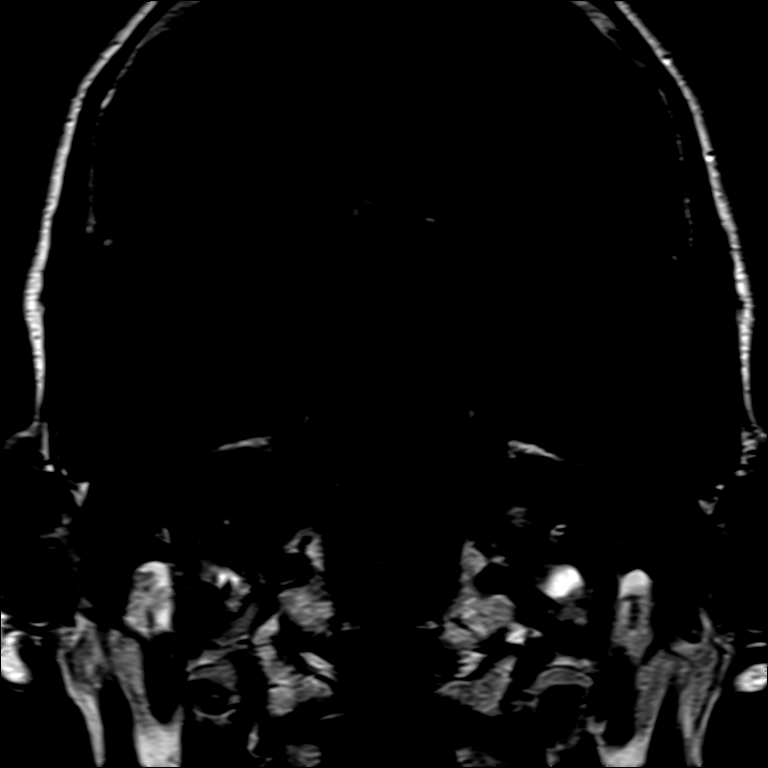

[Series 20: T2 · coronal · 3.0mm · 0.21mm/px · 1 of 13 slices shown (3 of 3)]
[im 1/13]
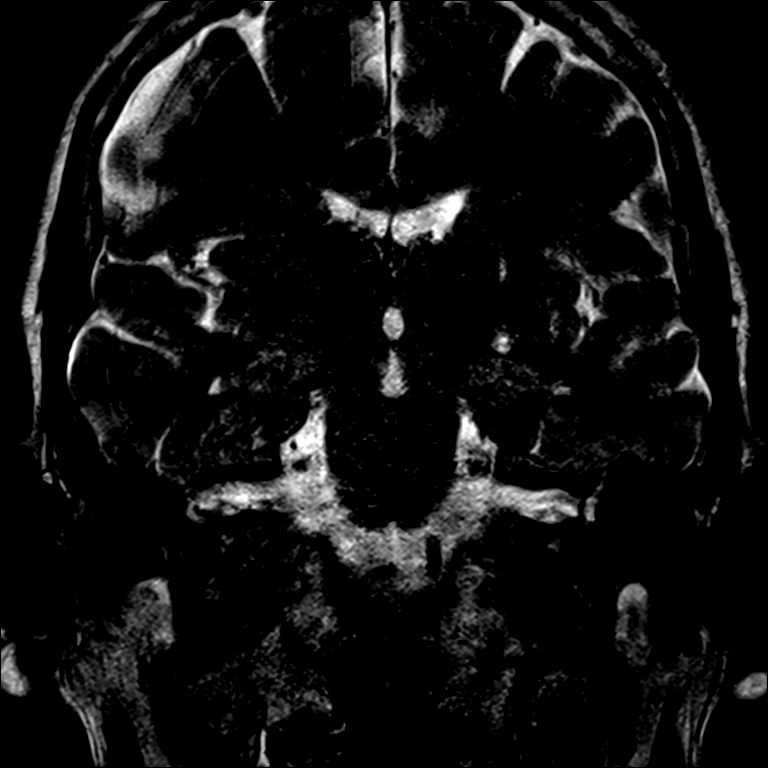

[31 of 48 positions shown; findings below may reference images not displayed]

FINDINGS: Brain: No acute infarct, mass effect or extra-axial collection. No
acute or chronic hemorrhage. Normal white matter signal, parenchymal
volume and CSF spaces. The midline structures are normal. The
pituitary gland is morphologically normal, but assessment for
microadenoma is severely limited without intravenous contrast
material.

Vascular: Major flow voids are preserved.

Skull and upper cervical spine: Normal calvarium and skull base.
Visualized upper cervical spine and soft tissues are normal.

Sinuses/Orbits:Left maxillary sinus mucosal thickening. Normal
orbits.
IMPRESSION: 1. Normal MRI of the brain.
2. Pituitary gland is morphologically normal, but assessment for
microadenoma is severely limited without intravenous contrast
material.

## 2023-10-22 ENCOUNTER — Encounter: Payer: Self-pay | Admitting: Sleep Medicine

## 2023-10-22 ENCOUNTER — Ambulatory Visit: Admitting: Sleep Medicine

## 2023-10-22 VITALS — BP 128/76 | HR 67 | Temp 98.2°F | Ht 72.0 in | Wt 201.8 lb

## 2023-10-22 DIAGNOSIS — F17210 Nicotine dependence, cigarettes, uncomplicated: Secondary | ICD-10-CM | POA: Diagnosis not present

## 2023-10-22 DIAGNOSIS — I10 Essential (primary) hypertension: Secondary | ICD-10-CM

## 2023-10-22 DIAGNOSIS — G4733 Obstructive sleep apnea (adult) (pediatric): Secondary | ICD-10-CM | POA: Diagnosis not present

## 2023-10-22 DIAGNOSIS — G4723 Circadian rhythm sleep disorder, irregular sleep wake type: Secondary | ICD-10-CM

## 2023-10-22 NOTE — Patient Instructions (Signed)
 Chad Frye

## 2023-10-22 NOTE — Progress Notes (Signed)
 Name:Chad Frye MRN: [id number] DOB: 1960/09/13   CHIEF COMPLAINT:  EXCESSIVE DAYTIME SLEEPINESS   HISTORY OF PRESENT ILLNESS:  Chad Frye is a 62 y.o. w/ a h/o HTN who present for c/o loud snoring and excessive daytime sleepiness which has been present for several years. Reports nocturnal awakenings due to unclear reasons, and occasionally has difficulty falling back to sleep. Denies any significant weight changes. Denies morning headaches, RLS symptoms, dream enactment, cataplexy, hypnagogic or hypnapompic hallucinations. Reports a family history of sleep apnea. Reports occasionally drowsy driving. Drinks 100 ounces of coffee daily, occasional alcohol use, smokes 1-2 packs tobacco daily, denies illicit drug use. 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   Bedtime variable on work days, 10:30 pm on off days Sleep onset 10-45 mins Rise time variable, 8:30 am on off days   EPWORTH SLEEP SCORE 6    10/22/2023   10:00 AM  Results of the Epworth flowsheet  Sitting and reading 2  Watching TV 2  Sitting, inactive in a public place (e.g. a theatre or a meeting) 0  As a passenger in a car for an hour without a break 0  Lying down to rest in the afternoon when circumstances permit 1  Sitting and talking to someone 0  Sitting quietly after a lunch without alcohol 1  In a car, while stopped for a few minutes in traffic 0  Total score 6    PAST MEDICAL HISTORY :   has a past medical history of DM type 2 (diabetes mellitus, type 2) (HCC), Essential hypertension, Hyperlipidemia, and Seasonal allergies.  has a past surgical history that includes Appendectomy and Vasectomy. Prior to Admission medications   Medication Sig Start Date End Date Taking? Authorizing Provider  amLODipine -benazepril  (LOTREL) 10-40 MG capsule TAKE 1 CAPSULE BY MOUTH EVERY DAY 04/11/23  Yes Kent Pear, MD  cabergoline (DOSTINEX) 0.5 MG tablet Take by mouth. 11/29/21  Yes [provider]  carvedilol  (COREG ) 12.5 MG tablet TAKE 1 TABLET (12.5MG  TOTAL) BY MOUTH TWICE A DAY WITH MEALS 10/16/23  Yes Bluford Burkitt, NP  Evolocumab  (REPATHA  SURECLICK) 140 MG/ML SOAJ INJECT 140 MG INTO THE SKIN EVERY 14 (FOURTEEN) DAYS. 10/12/23  Yes Bluford Burkitt, NP  hydrochlorothiazide  (HYDRODIURIL ) 25 MG tablet Take 1 tablet (25 mg total) by mouth daily. 12/15/22  Yes Kent Pear, MD  levocetirizine (XYZAL ) 5 MG tablet Take 1 tablet (5 mg total) by mouth every evening. 10/16/23  Yes Bluford Burkitt, NP  MULTIPLE VITAMIN PO Take 1 tablet by mouth daily.   Yes [provider]  Semaglutide , 2 MG/DOSE, (OZEMPIC , 2 MG/DOSE,) 8 MG/3ML SOPN Inject 2 mg into the skin once a week. 09/18/23  Yes Bluford Burkitt, NP  tadalafil (CIALIS) 10 MG tablet Please specify directions, refills and quantity 05/29/19  Yes Kent Pear, MD  Testosterone  20.25 MG/ACT (1.62%) GEL SMARTSIG:2 pump Topical Daily 01/04/22  Yes [provider]  rosuvastatin  (CRESTOR ) 40 MG tablet Take 1 tablet (40 mg total) by mouth daily. 12/15/22   Kent Pear, MD   Allergies  Allergen Reactions   Pollen Extract Cough    Itchy watery eyes, stuffy nose, etc.    FAMILY HISTORY:  family history includes COPD in his sister; Diabetes in his mother; Heart disease in his brother and mother; Hypertension in his father and maternal grandmother; Prostate cancer in his father; Stroke in his father. SOCIAL HISTORY:  reports that he has been smoking  cigarettes. He started smoking about 6 years ago. He has a 9.6 pack-year smoking history. He has never been exposed to tobacco smoke. He has never used smokeless tobacco. He reports current alcohol use of about 4.0 standard drinks of alcohol per week. He reports that he does not use drugs.   Review of Systems:  Gen:  Denies  fever, sweats, chills weight loss  HEENT: Denies blurred vision, double vision, ear pain, eye pain, hearing loss, nose bleeds, sore throat Cardiac:  No  dizziness, chest pain or heaviness, chest tightness,edema, No JVD Resp:   No cough, -sputum production, -shortness of breath,-wheezing, -hemoptysis,  Gi: Denies swallowing difficulty, stomach pain, nausea or vomiting, diarrhea, constipation, bowel incontinence Gu:  Denies bladder incontinence, burning urine Ext:   Denies Joint pain, stiffness or swelling Skin: Denies  skin rash, easy bruising or bleeding or hives Endoc:  Denies polyuria, polydipsia , polyphagia or weight change Psych:   Denies depression, insomnia or hallucinations  Other:  All other systems negative  VITAL SIGNS: BP 128/76 (BP Location: Right Arm, Patient Position: Sitting, Cuff Size: Large)   Pulse 67   Temp 98.2 F (36.8 C) (Oral)   Ht 6' (1.829 m)   Wt 201 lb 12.8 oz (91.5 kg)   SpO2 96%   BMI 27.37 kg/m    Physical Examination:   General Appearance: No distress  EYES PERRLA, EOM intact.   NECK Supple, No JVD Pulmonary: normal breath sounds, No wheezing.  CardiovascularNormal S1,S2.  No m/r/g.   Abdomen: Benign, Soft, non-tender. Skin:   warm, no rashes, no ecchymosis  Extremities: normal, no cyanosis, clubbing. Neuro:without focal findings,  speech normal  PSYCHIATRIC: Mood, affect within normal limits.   ASSESSMENT AND PLAN  OSA I suspect that OSA is likely present due to clinical presentation. Discussed the consequences of untreated sleep apnea. Advised not to drive drowsy for safety of patient and others. Will complete further evaluation with a home sleep study and follow up to review results.    HTN Stable, on current management. Following with PCP.   Irregular sleep schedule Counseled patient on keeping a regular sleep schedule on all days.    MEDICATION ADJUSTMENTS/LABS AND TESTS ORDERED: Recommend Sleep Study   Patient  satisfied with Plan of action and management. All questions answered  Follow up to review HST results and treatment plan.   I spent a total of 62 minutes reviewing  chart data, face-to-face evaluation with the patient, counseling and coordination of care as detailed above.    Chad Frye, M.D.  Sleep Medicine [facility]

## 2023-11-10 ENCOUNTER — Encounter

## 2023-11-10 DIAGNOSIS — G4733 Obstructive sleep apnea (adult) (pediatric): Secondary | ICD-10-CM

## 2023-11-20 DIAGNOSIS — G4733 Obstructive sleep apnea (adult) (pediatric): Secondary | ICD-10-CM | POA: Diagnosis not present

## 2023-12-11 ENCOUNTER — Encounter: Payer: Self-pay | Admitting: Sleep Medicine

## 2023-12-11 ENCOUNTER — Other Ambulatory Visit: Payer: Self-pay | Admitting: Nurse Practitioner

## 2023-12-11 DIAGNOSIS — G4733 Obstructive sleep apnea (adult) (pediatric): Secondary | ICD-10-CM

## 2023-12-11 DIAGNOSIS — E7800 Pure hypercholesterolemia, unspecified: Secondary | ICD-10-CM

## 2023-12-13 ENCOUNTER — Other Ambulatory Visit: Payer: Self-pay

## 2023-12-13 DIAGNOSIS — I10 Essential (primary) hypertension: Secondary | ICD-10-CM

## 2023-12-13 MED ORDER — HYDROCHLOROTHIAZIDE 25 MG PO TABS: 25.0000 mg | ORAL_TABLET | Freq: Every day | ORAL | 3 refills | Status: AC

## 2024-01-10 NOTE — Telephone Encounter (Signed)
 Cpap order has been sent to [facility]

## 2024-01-15 ENCOUNTER — Other Ambulatory Visit: Payer: Self-pay | Admitting: Nurse Practitioner

## 2024-01-18 ENCOUNTER — Ambulatory Visit: Admitting: Nurse Practitioner

## 2024-01-18 VITALS — BP 124/82 | HR 72 | Temp 98.1°F | Ht 72.0 in | Wt 197.6 lb

## 2024-01-18 DIAGNOSIS — I10 Essential (primary) hypertension: Secondary | ICD-10-CM | POA: Diagnosis not present

## 2024-01-18 DIAGNOSIS — Z7985 Long-term (current) use of injectable non-insulin antidiabetic drugs: Secondary | ICD-10-CM

## 2024-01-18 DIAGNOSIS — G4733 Obstructive sleep apnea (adult) (pediatric): Secondary | ICD-10-CM

## 2024-01-18 DIAGNOSIS — G8929 Other chronic pain: Secondary | ICD-10-CM | POA: Diagnosis not present

## 2024-01-18 DIAGNOSIS — E119 Type 2 diabetes mellitus without complications: Secondary | ICD-10-CM | POA: Diagnosis not present

## 2024-01-18 DIAGNOSIS — M25561 Pain in right knee: Secondary | ICD-10-CM

## 2024-01-18 DIAGNOSIS — E7800 Pure hypercholesterolemia, unspecified: Secondary | ICD-10-CM

## 2024-01-18 LAB — HEMOGLOBIN A1C: Hgb A1c MFr Bld: 7.9 % — ABNORMAL HIGH (ref 4.6–6.5)

## 2024-01-18 LAB — MICROALBUMIN / CREATININE URINE RATIO
Creatinine,U: 177.7 mg/dL
Microalb Creat Ratio: 14.6 mg/g (ref 0.0–30.0)
Microalb, Ur: 2.6 mg/dL — ABNORMAL HIGH (ref 0.0–1.9)

## 2024-01-18 LAB — LIPID PANEL
Cholesterol: 123 mg/dL (ref 0–200)
HDL: 34.9 mg/dL — ABNORMAL LOW (ref >39.00)
LDL Cholesterol: 67 mg/dL (ref 0–99)
NonHDL: 87.78
Total CHOL/HDL Ratio: 4
Triglycerides: 103 mg/dL (ref 0.0–149.0)
VLDL: 20.6 mg/dL (ref 0.0–40.0)

## 2024-01-18 LAB — COMPREHENSIVE METABOLIC PANEL WITH GFR
ALT: 20 U/L (ref 0–53)
AST: 14 U/L (ref 0–37)
Albumin: 4.3 g/dL (ref 3.5–5.2)
Alkaline Phosphatase: 92 U/L (ref 39–117)
BUN: 9 mg/dL (ref 6–23)
CO2: 26 meq/L (ref 19–32)
Calcium: 9.3 mg/dL (ref 8.4–10.5)
Chloride: 98 meq/L (ref 96–112)
Creatinine, Ser: 0.79 mg/dL (ref 0.40–1.50)
GFR: 95.59 mL/min (ref >60.00)
Glucose, Bld: 156 mg/dL — ABNORMAL HIGH (ref 70–99)
Potassium: 4.7 meq/L (ref 3.5–5.1)
Sodium: 134 meq/L — ABNORMAL LOW (ref 135–145)
Total Bilirubin: 0.5 mg/dL (ref 0.2–1.2)
Total Protein: 6.9 g/dL (ref 6.0–8.3)

## 2024-01-18 NOTE — Progress Notes (Signed)
 Leron Glance, NP-C Phone: [phone]  Chad Frye is a 63 y.o. male who presents today for follow up.   Discussed the use of AI scribe software for clinical note transcription with the patient, who gave verbal consent to proceed.  History of Present Illness   Chad Frye is a 63 year old male who presents for a follow-up visit.  He has ongoing issues with his right knee and reports that a previous x-ray showed minimal arthritis in several compartments. He has tried glucosamine without relief and currently uses Motrin about twice a week for symptom management. 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  Regarding diabetes management, his Jardiance  was stopped, and his Ozempic  dose was increased to 2 mg, which he tolerates well without nausea, a side effect he experienced at a lower dose. He is no longer taking Crestor  and is managing his cholesterol with Repatha . He denies excessive thirst, excessive urination, and low blood sugars.  He sometimes checks his blood pressure at home, with readings typically around 112/78 mmHg, lower than the office reading of 124/82 mmHg. No chest pain, shortness of breath, dizziness, or swelling. His current medications include amlodipine , benazepril , carvedilol , and hydrochlorothiazide .  He completed a sleep study showing moderate sleep apnea but has not yet received his CPAP machine. He is in the process of setting up the equipment with a supply company.     Social History   Tobacco Use  Smoking Status Every Day   Current packs/day: 1.50   Average packs/day: 1.5 packs/day for 6.7 years (10.0 ttl pk-yrs)   Types: Cigarettes   Start date: 05/23/2017   Last attempt to quit: 11/28/2015   Passive exposure: Never  Smokeless Tobacco Never  Tobacco Comments   Cuurently smoking 1.5ppd 10/22/23    Current Outpatient  Medications on File Prior to Visit  Medication Sig Dispense Refill   amLODipine -benazepril  (LOTREL) 10-40 MG capsule TAKE 1 CAPSULE BY MOUTH EVERY DAY 90 capsule 3   carvedilol  (COREG ) 12.5 MG tablet TAKE 1 TABLET (12.5MG  TOTAL) BY MOUTH TWICE A DAY WITH MEALS 180 tablet 1   Evolocumab  (REPATHA  SURECLICK) 140 MG/ML SOAJ INJECT 140 MG INTO THE SKIN EVERY 14 (FOURTEEN) DAYS. 2 mL 5   hydrochlorothiazide  (HYDRODIURIL ) 25 MG tablet Take 1 tablet (25 mg total) by mouth daily. 90 tablet 3   levocetirizine (XYZAL ) 5 MG tablet Take 1 tablet (5 mg total) by mouth every evening. 90 tablet 1   MULTIPLE VITAMIN PO Take 1 tablet by mouth daily.     No current facility-administered medications on file prior to visit.     ROS see history of present illness  Objective  Physical Exam Vitals:   01/18/24 0816  BP: 124/82  Pulse: 72  Temp: 98.1 F (36.7 C)  SpO2: 97%    BP Readings from Last 3 Encounters:  01/18/24 124/82  10/22/23 128/76  09/18/23 110/66   Wt Readings from Last 3 Encounters:  01/18/24 197 lb 9.6 oz (89.6 kg)  10/22/23 201 lb 12.8 oz (91.5 kg)  09/18/23 201 lb (91.2 kg)    Physical Exam Constitutional:      General: He is not in acute distress.    Appearance: Normal appearance.  HENT:     Head: Normocephalic.  Cardiovascular:     Rate and Rhythm: Normal rate and regular rhythm.     Heart sounds: Normal heart sounds.  Pulmonary:     Effort: Pulmonary effort is normal.     Breath sounds: Normal breath sounds.  Skin:    General: Skin is warm and dry.  Neurological:     General: No focal deficit present.     Mental Status: He is alert.  Psychiatric:        Mood and Affect: Mood normal.        Behavior: Behavior normal.      Assessment/Plan: Please see individual problem list.  Type 2 diabetes mellitus without complication, without long-term current use of insulin (HCC) Assessment & Plan: His diabetes is managed with Ozempic  2 mg, which is well-tolerated  after resolving previous side effects with a lower dose. Jardiance  has been discontinued. Monitoring of A1c is ongoing. Order an A1c test to monitor diabetes control and continue with the current Ozempic  regimen. Encourage healthy diet and regular exercise.   Orders: -     Hemoglobin A1c -     Microalbumin / creatinine urine ratio  Chronic pain of right knee Assessment & Plan: Chronic osteoarthritis is present with minimal arthritis in multiple compartments. He prefers to avoid prescription medications and physical therapy. Although a cortisone injection was discussed, it is not preferred. Continue using over-the-counter NSAIDs as needed. Discuss prescription options like meloxicam or Celebrex if symptoms worsen. Consider referral to ortho and a cortisone injection if pain becomes unmanageable.    OSA (obstructive sleep apnea) Assessment & Plan: Moderate obstructive sleep apnea was diagnosed via a sleep study. CPAP therapy has not yet been initiated. He has been contacted by the supply company to set up CPAP equipment. Follow up with the supply company to set up the equipment and use CPAP as prescribed once received. Follow up with Pulmonology as scheduled.   Essential hypertension Assessment & Plan: His blood pressure is well-controlled on the current regimen. Continue Coreg , hydrochlorothiazide , amlodipine  and benazepril . Continue home monitoring.   Orders: -     Comprehensive metabolic panel with GFR  Pure hypercholesterolemia Assessment & Plan: Managed with Repatha  only, Crestor  discontinued. Continue Repatha  injections. Check lipid panel.   Orders: -     Lipid panel     Return in about 4 months (around 05/19/2024) for Follow up.   Leron Glance, NP-C [facility]

## 2024-01-25 ENCOUNTER — Ambulatory Visit: Payer: Self-pay | Admitting: Nurse Practitioner

## 2024-01-25 ENCOUNTER — Other Ambulatory Visit: Payer: Self-pay | Admitting: Nurse Practitioner

## 2024-01-25 DIAGNOSIS — E119 Type 2 diabetes mellitus without complications: Secondary | ICD-10-CM

## 2024-01-25 MED ORDER — TIRZEPATIDE 7.5 MG/0.5ML ~~LOC~~ SOAJ: 7.5000 mg | SUBCUTANEOUS | 0 refills | Status: DC

## 2024-01-25 NOTE — Telephone Encounter (Signed)
 Copied from CRM [id number]. Topic: Clinical - Lab/Test Results >> Jan 25, 2024  4:06 PM Delon DASEN wrote: Reason for CRM: returning call about results, he would like to switch to Mounjaro .

## 2024-01-28 ENCOUNTER — Telehealth: Payer: Self-pay | Admitting: Sleep Medicine

## 2024-01-28 NOTE — Telephone Encounter (Signed)
 LVMTCB to schedule CPAP compliance between 10-10/2023 to 04/24/2024.

## 2024-01-30 ENCOUNTER — Other Ambulatory Visit (HOSPITAL_COMMUNITY): Payer: Self-pay

## 2024-01-31 ENCOUNTER — Encounter: Payer: Self-pay | Admitting: Nurse Practitioner

## 2024-01-31 NOTE — Assessment & Plan Note (Signed)
 His blood pressure is well-controlled on the current regimen. Continue Coreg , hydrochlorothiazide , amlodipine  and benazepril . Continue home monitoring.

## 2024-01-31 NOTE — Assessment & Plan Note (Signed)
 His diabetes is managed with Ozempic  2 mg, which is well-tolerated after resolving previous side effects with a lower dose. Jardiance  has been discontinued. Monitoring of A1c is ongoing. Order an A1c test to monitor diabetes control and continue with the current Ozempic  regimen. Encourage healthy diet and regular exercise.

## 2024-01-31 NOTE — Assessment & Plan Note (Signed)
 Managed with Repatha  only, Crestor  discontinued. Continue Repatha  injections. Check lipid panel.

## 2024-01-31 NOTE — Assessment & Plan Note (Signed)
 Moderate obstructive sleep apnea was diagnosed via a sleep study. CPAP therapy has not yet been initiated. He has been contacted by the supply company to set up CPAP equipment. Follow up with the supply company to set up the equipment and use CPAP as prescribed once received. Follow up with Pulmonology as scheduled.

## 2024-01-31 NOTE — Assessment & Plan Note (Signed)
 Chronic osteoarthritis is present with minimal arthritis in multiple compartments. He prefers to avoid prescription medications and physical therapy. Although a cortisone injection was discussed, it is not preferred. Continue using over-the-counter NSAIDs as needed. Discuss prescription options like meloxicam or Celebrex if symptoms worsen. Consider referral to ortho and a cortisone injection if pain becomes unmanageable.

## 2024-02-04 ENCOUNTER — Telehealth: Payer: Self-pay

## 2024-02-04 NOTE — Telephone Encounter (Signed)
PA needed for mounjaro

## 2024-02-05 ENCOUNTER — Other Ambulatory Visit (HOSPITAL_COMMUNITY): Payer: Self-pay

## 2024-02-05 ENCOUNTER — Telehealth: Payer: Self-pay

## 2024-02-05 NOTE — Telephone Encounter (Signed)
 PA request has been Started. New Encounter has been or will be created for follow up. For additional info see Pharmacy Prior Auth telephone encounter from 02/05/2024.

## 2024-02-05 NOTE — Telephone Encounter (Signed)
 Pharmacy Patient Advocate Encounter   Received notification from Physician's Office that prior authorization for Mounjaro  7.5MG /0.5ML auto-injectors  is required/requested.   Insurance verification completed.   The patient is insured through [facility] .   Per test claim: PA required; PA submitted to above mentioned insurance via Latent Key/confirmation #/EOC [id number] Status is pending  *WAITING FOR QUESTIONS TO POPULATE*

## 2024-02-07 ENCOUNTER — Other Ambulatory Visit (HOSPITAL_COMMUNITY): Payer: Self-pay

## 2024-02-07 ENCOUNTER — Telehealth: Payer: Self-pay

## 2024-02-07 NOTE — Telephone Encounter (Signed)
 Pharmacy Patient Advocate Encounter   Received notification from Physician's Office that prior authorization for Mounjaro  7.5 mg/0.5 ml pen is required/requested.   Insurance verification completed.   The patient is insured through [employer] .   Per test claim: PA required; PA submitted to above mentioned insurance via Prompt PA Key/confirmation #/EOC [id number] Status is pending

## 2024-02-07 NOTE — Telephone Encounter (Signed)
 Prior auth closed in latent. Per latent it has to be completed on prompt pa.

## 2024-02-08 NOTE — Telephone Encounter (Signed)
 Noted

## 2024-02-11 ENCOUNTER — Other Ambulatory Visit (HOSPITAL_COMMUNITY): Payer: Self-pay

## 2024-02-11 NOTE — Telephone Encounter (Signed)
 Pharmacy Patient Advocate Encounter  Received notification from [facility] [employer] that Prior Authorization for Mounjaro  7.5 mg/0.5 ml pen  has been APPROVED from 02/07/2024 to 02/06/2025. Ran test claim, Copay is $25. This test claim was processed through [facility]- copay amounts may vary at other pharmacies due to pharmacy/plan contracts, or as the patient moves through the different stages of their insurance plan.   PA #/Case ID/Reference #: [id number]

## 2024-02-18 DIAGNOSIS — G4733 Obstructive sleep apnea (adult) (pediatric): Secondary | ICD-10-CM | POA: Diagnosis not present

## 2024-02-19 DIAGNOSIS — G4733 Obstructive sleep apnea (adult) (pediatric): Secondary | ICD-10-CM | POA: Diagnosis not present

## 2024-03-05 ENCOUNTER — Other Ambulatory Visit: Payer: Self-pay | Admitting: Nurse Practitioner

## 2024-03-05 DIAGNOSIS — E119 Type 2 diabetes mellitus without complications: Secondary | ICD-10-CM

## 2024-03-24 ENCOUNTER — Ambulatory Visit: Admitting: Sleep Medicine

## 2024-03-28 ENCOUNTER — Encounter: Payer: Self-pay | Admitting: Pharmacist

## 2024-03-28 ENCOUNTER — Encounter: Payer: Self-pay | Admitting: Sleep Medicine

## 2024-03-28 ENCOUNTER — Ambulatory Visit: Admitting: Sleep Medicine

## 2024-03-28 VITALS — BP 136/88 | HR 62 | Temp 97.9°F | Ht 72.0 in | Wt 196.0 lb

## 2024-03-28 DIAGNOSIS — F17210 Nicotine dependence, cigarettes, uncomplicated: Secondary | ICD-10-CM

## 2024-03-28 DIAGNOSIS — G4733 Obstructive sleep apnea (adult) (pediatric): Secondary | ICD-10-CM | POA: Diagnosis not present

## 2024-03-28 DIAGNOSIS — I10 Essential (primary) hypertension: Secondary | ICD-10-CM | POA: Diagnosis not present

## 2024-03-28 NOTE — Progress Notes (Signed)
 Name:Chad Frye MRN: [id number] DOB: 1961/03/06   CHIEF COMPLAINT:  CPAP F/U   HISTORY OF PRESENT ILLNESS:  Chad Frye is a 63 y.o. w/ a h/o OSA and HTN who presents for CPAP follow up visit. Reports using CPAP therapy every night, which is confirmed by compliance data. He is currently using the Airfit F20 FFM, which is comfortable. Denies drowsy driving. Reports feeling more refreshed upon awakening with CPAP therapy.    EPWORTH SLEEP SCORE 6    10/22/2023   10:00 AM  Results of the Epworth flowsheet  Sitting and reading 2  Watching TV 2  Sitting, inactive in a public place (e.g. a theatre or a meeting) 0  As a passenger in a car for an hour without a break 0  Lying down to rest in the afternoon when circumstances permit 1  Sitting and talking to someone 0  Sitting quietly after a lunch without alcohol 1  In a car, while stopped for a few minutes in traffic 0  Total score 6    PAST MEDICAL HISTORY :   has a past medical history of DM type 2 (diabetes mellitus, type 2) (HCC), Essential hypertension, Hyperlipidemia, and Seasonal allergies.  has a past surgical history that includes Appendectomy and Vasectomy. Prior to Admission medications   Medication Sig Start Date End Date Taking? Authorizing Provider  amLODipine -benazepril  (LOTREL) 10-40 MG capsule TAKE 1 CAPSULE BY MOUTH EVERY DAY 04/11/23  Yes Maribeth Camellia MATSU, MD  cabergoline (DOSTINEX) 0.5 MG tablet Take by mouth. 11/29/21  Yes [provider]  carvedilol  (COREG ) 12.5 MG tablet TAKE 1 TABLET (12.5MG  TOTAL) BY MOUTH TWICE A DAY WITH MEALS 10/16/23  Yes Gretel App, NP  Evolocumab  (REPATHA  SURECLICK) 140 MG/ML SOAJ INJECT 140 MG INTO THE SKIN EVERY 14 (FOURTEEN) DAYS. 10/12/23  Yes Gretel App, NP  hydrochlorothiazide  (HYDRODIURIL ) 25 MG tablet Take 1 tablet (25 mg total) by mouth daily. 12/15/22  Yes Maribeth Camellia MATSU, MD  levocetirizine (XYZAL ) 5 MG tablet Take 1 tablet (5 mg total) by mouth every  evening. 10/16/23  Yes Gretel App, NP  MULTIPLE VITAMIN PO Take 1 tablet by mouth daily.   Yes [provider]  Semaglutide , 2 MG/DOSE, (OZEMPIC , 2 MG/DOSE,) 8 MG/3ML SOPN Inject 2 mg into the skin once a week. 09/18/23  Yes Gretel App, NP  tadalafil (CIALIS) 10 MG tablet Please specify directions, refills and quantity 05/29/19  Yes Maribeth Camellia MATSU, MD  Testosterone  20.25 MG/ACT (1.62%) GEL SMARTSIG:2 pump Topical Daily 01/04/22  Yes [provider]  rosuvastatin  (CRESTOR ) 40 MG tablet Take 1 tablet (40 mg total) by mouth daily. 12/15/22   Maribeth Camellia MATSU, MD   Allergies  Allergen Reactions   Pollen Extract Cough    Itchy watery eyes, stuffy nose, etc.    FAMILY HISTORY:  family history includes COPD in his sister; Diabetes in his mother; Heart disease in his brother and mother; Hypertension in his father and maternal grandmother; Prostate cancer in his father; Stroke in his father. SOCIAL HISTORY:  reports that he has been smoking cigarettes. He started smoking about 6 years ago. He has a 10.3 pack-year smoking history. He has never been exposed to tobacco smoke. He has never used smokeless tobacco. He reports current alcohol use of about 4.0 standard drinks of alcohol per week. He reports that he does not use drugs.   Review of Systems:  Gen:  Denies  fever, sweats, chills weight loss  HEENT:  Denies blurred vision, double vision, ear pain, eye pain, hearing loss, nose bleeds, sore throat Cardiac:  No dizziness, chest pain or heaviness, chest tightness,edema, No JVD Resp:   No cough, -sputum production, -shortness of breath,-wheezing, -hemoptysis,  Gi: Denies swallowing difficulty, stomach pain, nausea or vomiting, diarrhea, constipation, bowel incontinence Gu:  Denies bladder incontinence, burning urine Ext:   Denies Joint pain, stiffness or swelling Skin: Denies  skin rash, easy bruising or bleeding or hives Endoc:  Denies polyuria, polydipsia , polyphagia or  weight change Psych:   Denies depression, insomnia or hallucinations  Other:  All other systems negative  VITAL SIGNS: BP 136/88   Pulse 62   Temp 97.9 F (36.6 C) (Temporal)   Ht 6' (1.829 m)   Wt 196 lb (88.9 kg)   SpO2 97%   BMI 26.58 kg/m    Physical Examination:   General Appearance: No distress  EYES PERRLA, EOM intact.   NECK Supple, No JVD Pulmonary: normal breath sounds, No wheezing.  CardiovascularNormal S1,S2.  No m/r/g.   Abdomen: Benign, Soft, non-tender. Skin:   warm, no rashes, no ecchymosis  Extremities: normal, no cyanosis, clubbing. Neuro:without focal findings,  speech normal  PSYCHIATRIC: Mood, affect within normal limits.   ASSESSMENT AND PLAN  OSA Patient is using and benefiting from CPAP therapy. Discussed the consequences of untreated sleep apnea. Advised not to drive drowsy for safety of patient and others. Will follow up in 3 months.   HTN Stable, on current management. Following with PCP.    Patient  satisfied with Plan of action and management. All questions answered  I spent a total of 21 minutes reviewing chart data, face-to-face evaluation with the patient, counseling and coordination of care as detailed above.    Dyanna Seiter, M.D.  Sleep Medicine [facility]

## 2024-03-28 NOTE — Patient Instructions (Signed)

## 2024-03-28 NOTE — Progress Notes (Signed)
 Pharmacy Quality Measure Review  This patient is appearing on a report for being at risk of failing the Kidney Health Evaluation for Patients with Diabetes measure this calendar year.   Insurance report was not up to date.  Last documented UACR resulted as of: Lab Results  Component Value Date   MICRALBCREAT 14.6 01/18/2024   Measure closed.

## 2024-04-03 NOTE — Progress Notes (Signed)
 Chad Frye                                          MRN: [id number]   04/03/2024   The [facility]BCI Quality Team Specialist reviewed this patient medical record for the purposes of chart review for care gap closure. The following were reviewed: abstraction for care gap closure-kidney health evaluation for diabetes:eGFR  and uACR.    VBCI Quality Team

## 2024-04-04 ENCOUNTER — Other Ambulatory Visit: Payer: Self-pay | Admitting: Nurse Practitioner

## 2024-04-09 ENCOUNTER — Telehealth: Payer: Self-pay

## 2024-04-09 NOTE — Telephone Encounter (Signed)
 I spoke with patient and apologized, letting him know that we were given an incorrect date earlier for his new appointment with Leron Glance, FNP-C.  I let patient know that Leron will be out of the office on 05/23/2024, and she has opened 05/19/2024 on her schedule.  Patient states he will be able to see Kacy on 05/19/2024 at 8am.  I moved patient to the new date in our system.

## 2024-04-19 DIAGNOSIS — G4733 Obstructive sleep apnea (adult) (pediatric): Secondary | ICD-10-CM | POA: Diagnosis not present

## 2024-05-06 ENCOUNTER — Other Ambulatory Visit: Payer: Self-pay | Admitting: Nurse Practitioner

## 2024-05-06 DIAGNOSIS — E119 Type 2 diabetes mellitus without complications: Secondary | ICD-10-CM

## 2024-05-19 ENCOUNTER — Encounter: Payer: Self-pay | Admitting: Nurse Practitioner

## 2024-05-19 ENCOUNTER — Ambulatory Visit: Admitting: Nurse Practitioner

## 2024-05-19 VITALS — BP 120/68 | HR 67 | Temp 98.2°F | Ht 72.0 in | Wt 198.8 lb

## 2024-05-19 DIAGNOSIS — I10 Essential (primary) hypertension: Secondary | ICD-10-CM | POA: Diagnosis not present

## 2024-05-19 DIAGNOSIS — E7800 Pure hypercholesterolemia, unspecified: Secondary | ICD-10-CM | POA: Diagnosis not present

## 2024-05-19 DIAGNOSIS — Z7985 Long-term (current) use of injectable non-insulin antidiabetic drugs: Secondary | ICD-10-CM | POA: Diagnosis not present

## 2024-05-19 DIAGNOSIS — G4733 Obstructive sleep apnea (adult) (pediatric): Secondary | ICD-10-CM

## 2024-05-19 DIAGNOSIS — E119 Type 2 diabetes mellitus without complications: Secondary | ICD-10-CM | POA: Diagnosis not present

## 2024-05-19 LAB — POCT GLYCOSYLATED HEMOGLOBIN (HGB A1C)
HbA1c POC (<> result, manual entry): 7.3 %
HbA1c, POC (controlled diabetic range): 7.3 % — AB (ref 0.0–7.0)
HbA1c, POC (prediabetic range): 7.3 % — AB (ref 5.7–6.4)
Hemoglobin A1C: 7.3 % — AB (ref 4.0–5.6)

## 2024-05-19 MED ORDER — TIRZEPATIDE 10 MG/0.5ML ~~LOC~~ SOAJ: 10.0000 mg | SUBCUTANEOUS | 1 refills | Status: AC

## 2024-05-19 NOTE — Assessment & Plan Note (Signed)
 Managed with CPAP, though he reports significant discomfort and dissatisfaction. He uses CPAP nightly. Continue CPAP use. Follow up with Pulmonology as scheduled.

## 2024-05-19 NOTE — Assessment & Plan Note (Signed)
 Managed with Repatha  every 2 weeks. Continue Repatha  injections.

## 2024-05-19 NOTE — Assessment & Plan Note (Signed)
 His blood pressure is well-controlled on the current regimen. Continue Coreg , hydrochlorothiazide , amlodipine  and benazepril . Continue home monitoring.

## 2024-05-19 NOTE — Assessment & Plan Note (Signed)
 A1c was 7.9% four months ago, indicating suboptimal control. He is currently on Mounjaro  7.5 mg weekly without side effects. The goal is to achieve an A1c of less than 7%. POCT A1c was 7.3 today, indicating improvement. Increase Mounjaro  to 10 mg weekly. Encourage healthy diet and regular exercise.

## 2024-05-19 NOTE — Progress Notes (Signed)
 " Leron Glance, NP-C Phone: [phone]  Chad Frye is a 63 y.o. male who presents today for follow up.   Discussed the use of AI scribe software for clinical note transcription with the patient, who gave verbal consent to proceed.  History of Present Illness   Chad Frye is a 63 year old male with type 2 diabetes and hypertension who presents for follow-up of his diabetes management.  He manages his type 2 diabetes with Mounjaro , taken weekly. Previously, he used Ozempic  and discontinued Jardiance  due to side effects. His last A1c, checked four months ago, was 7.9%. He does not monitor his blood sugar at home and has no symptoms of hypoglycemia such as feeling low or experiencing excessive thirst.  His hypertension is managed with hydrochlorothiazide , amlodipine , and benazepril . He occasionally checks his blood pressure at home and reports good readings, with today's measurement being 120/68 mmHg. No chest pain, shortness of breath, dizziness, or swelling.  He uses Repatha  for cholesterol management and is satisfied with this medication.  He is dissatisfied with his CPAP machine, used nightly for sleep apnea, as it disrupts his sleep by waking him up frequently and causing discomfort, such as a sensation of water on his face despite low humidity settings.  No nausea, vomiting, diarrhea, constipation, chest pain, shortness of breath, dizziness, swelling, low blood sugars, or excessive thirst.      Tobacco Use History[1]  Medications Ordered Prior to Encounter[2]   ROS see history of present illness  Objective  Physical Exam Vitals:   05/19/24 0816  BP: 120/68  Pulse: 67  Temp: 98.2 F (36.8 C)  SpO2: 93%    BP Readings from Last 3 Encounters:  05/19/24 120/68  03/28/24 136/88  01/18/24 124/82   Wt Readings from Last 3 Encounters:  05/19/24 198 lb 12.8 oz (90.2 kg)  03/28/24 196 lb (88.9 kg)  01/18/24 197 lb 9.6 oz (89.6 kg)    Physical  Exam Constitutional:      General: He is not in acute distress.    Appearance: Normal appearance.  HENT:     Head: Normocephalic.  Cardiovascular:     Rate and Rhythm: Normal rate and regular rhythm.     Heart sounds: Normal heart sounds.  Pulmonary:     Effort: Pulmonary effort is normal.     Breath sounds: Normal breath sounds.  Skin:    General: Skin is warm and dry.  Neurological:     General: No focal deficit present.     Mental Status: He is alert.  Psychiatric:        Mood and Affect: Mood normal.        Behavior: Behavior normal.      Assessment/Plan: Please see individual problem list.  Type 2 diabetes mellitus without complication, without long-term current use of insulin (HCC) Assessment & Plan: A1c was 7.9% four months ago, indicating suboptimal control. He is currently on Mounjaro  7.5 mg weekly without side effects. The goal is to achieve an A1c of less than 7%. POCT A1c was 7.3 today, indicating improvement. Increase Mounjaro  to 10 mg weekly. Encourage healthy diet and regular exercise.   Orders: -     POCT glycosylated hemoglobin (Hb A1C) -     Tirzepatide ; Inject 10 mg into the skin once a week.  Dispense: 6 mL; Refill: 1  Pure hypercholesterolemia Assessment & Plan: Managed with Repatha  every 2 weeks. Continue Repatha  injections.   Essential hypertension Assessment & Plan: His blood pressure is  well-controlled on the current regimen. Continue Coreg , hydrochlorothiazide , amlodipine  and benazepril . Continue home monitoring.    OSA (obstructive sleep apnea) Assessment & Plan: Managed with CPAP, though he reports significant discomfort and dissatisfaction. He uses CPAP nightly. Continue CPAP use. Follow up with Pulmonology as scheduled.       Return in about 4 months (around 09/17/2024) for Follow up.   Leron Glance, NP-C [facility] [place]    [1]  Social History Tobacco Use  Smoking Status Every Day   Current packs/day:  1.50   Average packs/day: 1.5 packs/day for 7.0 years (10.5 ttl pk-yrs)   Types: Cigarettes   Start date: 05/23/2017   Last attempt to quit: 11/28/2015   Passive exposure: Never  Smokeless Tobacco Never  Tobacco Comments   Cuurently smoking 1.5ppd 10/22/23  [2]  Current Outpatient Medications on File Prior to Visit  Medication Sig Dispense Refill   amLODipine -benazepril  (LOTREL) 10-40 MG capsule TAKE 1 CAPSULE BY MOUTH EVERY DAY 90 capsule 3   carvedilol  (COREG ) 12.5 MG tablet TAKE 1 TABLET (12.5MG  TOTAL) BY MOUTH TWICE A DAY WITH MEALS 180 tablet 1   Evolocumab  (REPATHA  SURECLICK) 140 MG/ML SOAJ INJECT 140 MG INTO THE SKIN EVERY 14 (FOURTEEN) DAYS. 2 mL 5   hydrochlorothiazide  (HYDRODIURIL ) 25 MG tablet Take 1 tablet (25 mg total) by mouth daily. 90 tablet 3   levocetirizine (XYZAL ) 5 MG tablet TAKE 1 TABLET BY MOUTH EVERY DAY IN THE EVENING 90 tablet 1   MULTIPLE VITAMIN PO Take 1 tablet by mouth daily.     No current facility-administered medications on file prior to visit.   "

## 2024-05-23 ENCOUNTER — Ambulatory Visit: Admitting: Nurse Practitioner

## 2024-06-10 ENCOUNTER — Other Ambulatory Visit: Payer: Self-pay

## 2024-06-10 MED ORDER — AMLODIPINE BESY-BENAZEPRIL HCL 10-40 MG PO CAPS: ORAL_CAPSULE | ORAL | 3 refills | Status: AC

## 2024-06-17 ENCOUNTER — Other Ambulatory Visit: Payer: Self-pay | Admitting: Nurse Practitioner

## 2024-06-17 DIAGNOSIS — E7800 Pure hypercholesterolemia, unspecified: Secondary | ICD-10-CM

## 2024-06-30 ENCOUNTER — Ambulatory Visit: Admitting: Sleep Medicine

## 2024-07-21 ENCOUNTER — Ambulatory Visit: Admitting: Sleep Medicine

## 2024-09-18 ENCOUNTER — Ambulatory Visit: Admitting: Nurse Practitioner
